# Patient Record
Sex: Female | Born: 1935
Health system: Southern US, Community
[De-identification: ages and names within clinical notes are randomized; demographics above are authoritative.]

## PROBLEM LIST (undated history)

## (undated) DIAGNOSIS — L819 Disorder of pigmentation, unspecified: Secondary | ICD-10-CM

## (undated) DIAGNOSIS — E079 Disorder of thyroid, unspecified: Secondary | ICD-10-CM

## (undated) DIAGNOSIS — E785 Hyperlipidemia, unspecified: Secondary | ICD-10-CM

## (undated) DIAGNOSIS — I10 Essential (primary) hypertension: Secondary | ICD-10-CM

## (undated) DIAGNOSIS — I739 Peripheral vascular disease, unspecified: Secondary | ICD-10-CM

## (undated) HISTORY — DX: Essential (primary) hypertension: I10

## (undated) HISTORY — DX: Disorder of pigmentation, unspecified: L81.9

## (undated) HISTORY — DX: Peripheral vascular disease, unspecified: I73.9

## (undated) HISTORY — DX: Hyperlipidemia, unspecified: E78.5

---

## 2003-10-07 ENCOUNTER — Encounter: Admission: RE | Admit: 2003-10-07 | Discharge: 2003-10-07 | Payer: Self-pay | Admitting: Neurosurgery

## 2004-04-21 ENCOUNTER — Ambulatory Visit (HOSPITAL_COMMUNITY): Admission: RE | Admit: 2004-04-21 | Discharge: 2004-04-21 | Payer: Self-pay | Admitting: Neurosurgery

## 2011-08-30 DIAGNOSIS — L819 Disorder of pigmentation, unspecified: Secondary | ICD-10-CM

## 2011-08-30 HISTORY — DX: Disorder of pigmentation, unspecified: L81.9

## 2011-12-13 DIAGNOSIS — M5430 Sciatica, unspecified side: Secondary | ICD-10-CM | POA: Diagnosis not present

## 2011-12-13 DIAGNOSIS — IMO0002 Reserved for concepts with insufficient information to code with codable children: Secondary | ICD-10-CM | POA: Diagnosis not present

## 2011-12-28 DIAGNOSIS — H353290 Exudative age-related macular degeneration, unspecified eye, stage unspecified: Secondary | ICD-10-CM | POA: Diagnosis not present

## 2011-12-29 DIAGNOSIS — S32009A Unspecified fracture of unspecified lumbar vertebra, initial encounter for closed fracture: Secondary | ICD-10-CM | POA: Diagnosis not present

## 2012-01-07 DIAGNOSIS — M5126 Other intervertebral disc displacement, lumbar region: Secondary | ICD-10-CM | POA: Diagnosis not present

## 2012-01-07 DIAGNOSIS — M48061 Spinal stenosis, lumbar region without neurogenic claudication: Secondary | ICD-10-CM | POA: Diagnosis not present

## 2012-01-07 DIAGNOSIS — S32009A Unspecified fracture of unspecified lumbar vertebra, initial encounter for closed fracture: Secondary | ICD-10-CM | POA: Diagnosis not present

## 2012-01-11 DIAGNOSIS — M48061 Spinal stenosis, lumbar region without neurogenic claudication: Secondary | ICD-10-CM | POA: Diagnosis not present

## 2012-01-17 DIAGNOSIS — M48061 Spinal stenosis, lumbar region without neurogenic claudication: Secondary | ICD-10-CM | POA: Diagnosis not present

## 2012-01-17 DIAGNOSIS — S32009A Unspecified fracture of unspecified lumbar vertebra, initial encounter for closed fracture: Secondary | ICD-10-CM | POA: Diagnosis not present

## 2012-01-25 DIAGNOSIS — H353290 Exudative age-related macular degeneration, unspecified eye, stage unspecified: Secondary | ICD-10-CM | POA: Diagnosis not present

## 2012-02-07 DIAGNOSIS — M48061 Spinal stenosis, lumbar region without neurogenic claudication: Secondary | ICD-10-CM | POA: Diagnosis not present

## 2012-02-07 DIAGNOSIS — S32009A Unspecified fracture of unspecified lumbar vertebra, initial encounter for closed fracture: Secondary | ICD-10-CM | POA: Diagnosis not present

## 2012-03-07 DIAGNOSIS — H353290 Exudative age-related macular degeneration, unspecified eye, stage unspecified: Secondary | ICD-10-CM | POA: Diagnosis not present

## 2012-03-13 DIAGNOSIS — S32009A Unspecified fracture of unspecified lumbar vertebra, initial encounter for closed fracture: Secondary | ICD-10-CM | POA: Diagnosis not present

## 2012-03-13 DIAGNOSIS — M48061 Spinal stenosis, lumbar region without neurogenic claudication: Secondary | ICD-10-CM | POA: Diagnosis not present

## 2012-04-25 DIAGNOSIS — H353290 Exudative age-related macular degeneration, unspecified eye, stage unspecified: Secondary | ICD-10-CM | POA: Diagnosis not present

## 2012-04-28 DIAGNOSIS — E860 Dehydration: Secondary | ICD-10-CM | POA: Diagnosis not present

## 2012-04-28 DIAGNOSIS — R634 Abnormal weight loss: Secondary | ICD-10-CM | POA: Diagnosis not present

## 2012-04-28 DIAGNOSIS — N3000 Acute cystitis without hematuria: Secondary | ICD-10-CM | POA: Diagnosis not present

## 2012-04-28 DIAGNOSIS — R1013 Epigastric pain: Secondary | ICD-10-CM | POA: Diagnosis not present

## 2012-05-01 DIAGNOSIS — R630 Anorexia: Secondary | ICD-10-CM | POA: Diagnosis not present

## 2012-05-01 DIAGNOSIS — E860 Dehydration: Secondary | ICD-10-CM | POA: Diagnosis not present

## 2012-05-05 DIAGNOSIS — R110 Nausea: Secondary | ICD-10-CM | POA: Diagnosis not present

## 2012-05-05 DIAGNOSIS — K7689 Other specified diseases of liver: Secondary | ICD-10-CM | POA: Diagnosis not present

## 2012-05-05 DIAGNOSIS — R1013 Epigastric pain: Secondary | ICD-10-CM | POA: Diagnosis not present

## 2012-05-05 DIAGNOSIS — R634 Abnormal weight loss: Secondary | ICD-10-CM | POA: Diagnosis not present

## 2012-05-10 DIAGNOSIS — IMO0002 Reserved for concepts with insufficient information to code with codable children: Secondary | ICD-10-CM | POA: Diagnosis not present

## 2012-05-23 DIAGNOSIS — H353290 Exudative age-related macular degeneration, unspecified eye, stage unspecified: Secondary | ICD-10-CM | POA: Diagnosis not present

## 2012-05-25 DIAGNOSIS — H40059 Ocular hypertension, unspecified eye: Secondary | ICD-10-CM | POA: Diagnosis not present

## 2012-06-02 DIAGNOSIS — R935 Abnormal findings on diagnostic imaging of other abdominal regions, including retroperitoneum: Secondary | ICD-10-CM | POA: Diagnosis not present

## 2012-06-15 DIAGNOSIS — H40059 Ocular hypertension, unspecified eye: Secondary | ICD-10-CM | POA: Diagnosis not present

## 2012-07-03 DIAGNOSIS — L0100 Impetigo, unspecified: Secondary | ICD-10-CM | POA: Diagnosis not present

## 2012-07-09 DIAGNOSIS — L02439 Carbuncle of limb, unspecified: Secondary | ICD-10-CM | POA: Diagnosis not present

## 2012-07-09 DIAGNOSIS — L02429 Furuncle of limb, unspecified: Secondary | ICD-10-CM | POA: Diagnosis not present

## 2012-07-11 DIAGNOSIS — H353290 Exudative age-related macular degeneration, unspecified eye, stage unspecified: Secondary | ICD-10-CM | POA: Diagnosis not present

## 2012-07-11 DIAGNOSIS — L02818 Cutaneous abscess of other sites: Secondary | ICD-10-CM | POA: Diagnosis not present

## 2012-07-11 DIAGNOSIS — L03818 Cellulitis of other sites: Secondary | ICD-10-CM | POA: Diagnosis not present

## 2012-07-11 DIAGNOSIS — IMO0002 Reserved for concepts with insufficient information to code with codable children: Secondary | ICD-10-CM | POA: Diagnosis not present

## 2012-07-24 DIAGNOSIS — L0390 Cellulitis, unspecified: Secondary | ICD-10-CM | POA: Diagnosis not present

## 2012-07-24 DIAGNOSIS — L0291 Cutaneous abscess, unspecified: Secondary | ICD-10-CM | POA: Diagnosis not present

## 2012-08-08 DIAGNOSIS — N3000 Acute cystitis without hematuria: Secondary | ICD-10-CM | POA: Diagnosis not present

## 2012-08-08 DIAGNOSIS — Z23 Encounter for immunization: Secondary | ICD-10-CM | POA: Diagnosis not present

## 2012-08-08 DIAGNOSIS — Z79899 Other long term (current) drug therapy: Secondary | ICD-10-CM | POA: Diagnosis not present

## 2012-08-08 DIAGNOSIS — E559 Vitamin D deficiency, unspecified: Secondary | ICD-10-CM | POA: Diagnosis not present

## 2012-08-08 DIAGNOSIS — M810 Age-related osteoporosis without current pathological fracture: Secondary | ICD-10-CM | POA: Diagnosis not present

## 2012-08-08 DIAGNOSIS — R413 Other amnesia: Secondary | ICD-10-CM | POA: Diagnosis not present

## 2012-08-15 DIAGNOSIS — S32009A Unspecified fracture of unspecified lumbar vertebra, initial encounter for closed fracture: Secondary | ICD-10-CM | POA: Diagnosis not present

## 2012-08-15 DIAGNOSIS — M48061 Spinal stenosis, lumbar region without neurogenic claudication: Secondary | ICD-10-CM | POA: Diagnosis not present

## 2012-08-16 DIAGNOSIS — Z1231 Encounter for screening mammogram for malignant neoplasm of breast: Secondary | ICD-10-CM | POA: Diagnosis not present

## 2012-08-23 DIAGNOSIS — R229 Localized swelling, mass and lump, unspecified: Secondary | ICD-10-CM | POA: Diagnosis not present

## 2012-09-05 DIAGNOSIS — H353290 Exudative age-related macular degeneration, unspecified eye, stage unspecified: Secondary | ICD-10-CM | POA: Diagnosis not present

## 2012-10-06 DIAGNOSIS — IMO0002 Reserved for concepts with insufficient information to code with codable children: Secondary | ICD-10-CM | POA: Diagnosis not present

## 2012-10-06 DIAGNOSIS — S32009A Unspecified fracture of unspecified lumbar vertebra, initial encounter for closed fracture: Secondary | ICD-10-CM | POA: Diagnosis not present

## 2012-10-06 DIAGNOSIS — M48061 Spinal stenosis, lumbar region without neurogenic claudication: Secondary | ICD-10-CM | POA: Diagnosis not present

## 2012-10-17 DIAGNOSIS — H40059 Ocular hypertension, unspecified eye: Secondary | ICD-10-CM | POA: Diagnosis not present

## 2012-10-30 DIAGNOSIS — IMO0002 Reserved for concepts with insufficient information to code with codable children: Secondary | ICD-10-CM | POA: Diagnosis not present

## 2012-10-30 DIAGNOSIS — M48061 Spinal stenosis, lumbar region without neurogenic claudication: Secondary | ICD-10-CM | POA: Diagnosis not present

## 2012-10-30 DIAGNOSIS — S32009A Unspecified fracture of unspecified lumbar vertebra, initial encounter for closed fracture: Secondary | ICD-10-CM | POA: Diagnosis not present

## 2012-11-20 DIAGNOSIS — IMO0002 Reserved for concepts with insufficient information to code with codable children: Secondary | ICD-10-CM | POA: Diagnosis not present

## 2012-11-20 DIAGNOSIS — M48061 Spinal stenosis, lumbar region without neurogenic claudication: Secondary | ICD-10-CM | POA: Diagnosis not present

## 2012-11-20 DIAGNOSIS — S32009A Unspecified fracture of unspecified lumbar vertebra, initial encounter for closed fracture: Secondary | ICD-10-CM | POA: Diagnosis not present

## 2012-12-12 DIAGNOSIS — H353290 Exudative age-related macular degeneration, unspecified eye, stage unspecified: Secondary | ICD-10-CM | POA: Diagnosis not present

## 2013-03-13 DIAGNOSIS — H353290 Exudative age-related macular degeneration, unspecified eye, stage unspecified: Secondary | ICD-10-CM | POA: Diagnosis not present

## 2013-03-30 DIAGNOSIS — R0609 Other forms of dyspnea: Secondary | ICD-10-CM | POA: Diagnosis not present

## 2013-03-30 DIAGNOSIS — Z0000 Encounter for general adult medical examination without abnormal findings: Secondary | ICD-10-CM | POA: Diagnosis not present

## 2013-03-30 DIAGNOSIS — R0989 Other specified symptoms and signs involving the circulatory and respiratory systems: Secondary | ICD-10-CM | POA: Diagnosis not present

## 2013-03-30 DIAGNOSIS — I8310 Varicose veins of unspecified lower extremity with inflammation: Secondary | ICD-10-CM | POA: Diagnosis not present

## 2013-04-09 DIAGNOSIS — R0609 Other forms of dyspnea: Secondary | ICD-10-CM | POA: Diagnosis not present

## 2013-04-09 DIAGNOSIS — I059 Rheumatic mitral valve disease, unspecified: Secondary | ICD-10-CM | POA: Diagnosis not present

## 2013-04-16 DIAGNOSIS — H353190 Nonexudative age-related macular degeneration, unspecified eye, stage unspecified: Secondary | ICD-10-CM | POA: Diagnosis not present

## 2013-05-04 DIAGNOSIS — H524 Presbyopia: Secondary | ICD-10-CM | POA: Diagnosis not present

## 2013-05-16 DIAGNOSIS — M5137 Other intervertebral disc degeneration, lumbosacral region: Secondary | ICD-10-CM | POA: Diagnosis not present

## 2013-05-22 DIAGNOSIS — H353290 Exudative age-related macular degeneration, unspecified eye, stage unspecified: Secondary | ICD-10-CM | POA: Diagnosis not present

## 2013-05-28 DIAGNOSIS — L2089 Other atopic dermatitis: Secondary | ICD-10-CM | POA: Diagnosis not present

## 2013-05-29 ENCOUNTER — Encounter: Payer: Self-pay | Admitting: Pharmacist Clinician (PhC)/ Clinical Pharmacy Specialist

## 2013-06-06 ENCOUNTER — Encounter: Payer: Self-pay | Admitting: Cardiovascular Disease

## 2013-06-13 ENCOUNTER — Ambulatory Visit (INDEPENDENT_AMBULATORY_CARE_PROVIDER_SITE_OTHER): Payer: Medicare Other | Admitting: Cardiovascular Disease

## 2013-06-13 ENCOUNTER — Ambulatory Visit
Admission: RE | Admit: 2013-06-13 | Discharge: 2013-06-13 | Disposition: A | Discharge status: Home / Self Care | Payer: Medicare Other | Source: Ambulatory Visit | Attending: Cardiovascular Disease | Admitting: Cardiovascular Disease

## 2013-06-13 ENCOUNTER — Encounter: Payer: Self-pay | Admitting: Cardiovascular Disease

## 2013-06-13 VITALS — BP 144/74 | HR 59 | Ht 62.0 in | Wt 142.4 lb

## 2013-06-13 DIAGNOSIS — R0600 Dyspnea, unspecified: Secondary | ICD-10-CM

## 2013-06-13 DIAGNOSIS — R0609 Other forms of dyspnea: Secondary | ICD-10-CM | POA: Diagnosis not present

## 2013-06-13 DIAGNOSIS — R0989 Other specified symptoms and signs involving the circulatory and respiratory systems: Secondary | ICD-10-CM

## 2013-06-13 DIAGNOSIS — J984 Other disorders of lung: Secondary | ICD-10-CM | POA: Diagnosis not present

## 2013-06-13 NOTE — Patient Instructions (Addendum)
PA and Lateral CXR to be performed at [address] [facility].  BNP - lab test- have drawn at [facility] - next door to [facility].  Your physician has recommended that you have a pulmonary function test. Pulmonary Function Tests are a group of tests that measure how well air moves in and out of your lungs.  Your physician recommends that you schedule a follow-up appointment in: 2-3 weeks following all tests.

## 2013-06-14 LAB — BRAIN NATRIURETIC PEPTIDE: Brain Natriuretic Peptide: 68.9 pg/mL (ref 0.0–100.0)

## 2013-06-15 ENCOUNTER — Telehealth: Payer: Self-pay | Admitting: *Deleted

## 2013-06-15 NOTE — Telephone Encounter (Signed)
Message copied by Vita Barley on Fri Jun 15, 2013  9:13 PM ------      Message from: Thurmon Fair      Created: Wed Jun 13, 2013  4:40 PM       CXR shows some slight scarring in the lungs -  Will discuss further at the follow up visit once we have PFTs. ------

## 2013-06-15 NOTE — Telephone Encounter (Signed)
Results of CXR given to pt and will review further after PFT's and next follow-up visit.

## 2013-06-17 DIAGNOSIS — J479 Bronchiectasis, uncomplicated: Secondary | ICD-10-CM | POA: Insufficient documentation

## 2013-06-17 DIAGNOSIS — R0600 Dyspnea, unspecified: Secondary | ICD-10-CM | POA: Insufficient documentation

## 2013-06-17 NOTE — Progress Notes (Signed)
Patient ID: Deanna Meyers, female   DOB: [birth year], 79 y.o.   MRN: [id number]     Reason for office visit Dyspnea  Deanna Meyers is referred in consultation by Dr. Sherral Hammers due to increasing dyspnea over the last 2 months. Referring diagnosis suggest this on exertion but discussion with the patient and her daughter suggests more problems with intermittent dyspnea at rest. Sometimes her daughter has noticed that her mother is short of breath while speaking on the phone. The patient appears to be less aware of this and the rest of the family. Occasionally she has wheezing.  Deanna Meyers recently underwent an echocardiogram in May of this year which showed normal left ventricular size and systolic function. There were no significant valvular abnormalities. The diastolic filling pattern was described as showing impaired relaxation, but one looks at the actual measurements today are appropriate for age. E/e' is not elevated suggesting normal filling pressures. The only finding raises concern for cardiac source of dyspnea is the markedly dilated left atrium at 52 mm.  Other than her age Deanna Meyers has little in the way of risk factors for coronary disease or other structural heart problems. She has taken statins for dyspnea and has intermittently required treatment for it appears to be very mild hypertension.  By duplex ultrasonography she has been demonstrated to have "small vessel disease" in her feet, but has no evidence whatsoever of large arterial obstruction.    No Known Allergies  Current Outpatient Prescriptions  Medication Sig Dispense Refill  . clopidogrel (PLAVIX) 75 MG tablet Take 75 mg by mouth daily.      Marland Kitchen donepezil (ARICEPT) 23 MG TABS tablet Take 23 mg by mouth at bedtime.      . hydrochlorothiazide (HYDRODIURIL) 25 MG tablet Take 25 mg by mouth daily.      . simvastatin (ZOCOR) 20 MG tablet Take 20 mg by mouth every evening.       No current facility-administered  medications for this visit.    Past Medical History  Diagnosis Date  . Discoloration of skin of toe 08/30/2011    LA doppler - bilateral ABI's - no evidence of arterial insufficiency; bilateral PVRs - normal waveforms; bilateral toe pressures - mod/severe perfusion for healing  . Hypertension   . PVD (peripheral vascular disease)     decreased blood flow to toes  . Hyperlipidemia     No past surgical history on file.  No family history on file.  History   Social History  . Marital Status: Widowed    Spouse Name: N/A    Number of Children: N/A  . Years of Education: N/A   Occupational History  . Not on file.   Social History Main Topics  . Smoking status: Never Smoker   . Smokeless tobacco: Not on file  . Alcohol Use: No  . Drug Use: No  . Sexually Active: Not on file   Other Topics Concern  . Not on file   Social History Narrative  . No narrative on file    Review of systems: The patient specifically denies any chest pain at rest or with exertion,  orthopnea, paroxysmal nocturnal dyspnea, syncope, palpitations, focal neurological deficits, intermittent claudication, lower extremity edema, unexplained weight gain, cough, hemoptysis.  The patient also denies abdominal pain, nausea, vomiting, dysphagia, diarrhea, constipation, polyuria, polydipsia, dysuria, hematuria, frequency, urgency, abnormal bleeding or bruising, fever, chills, unexpected weight changes, mood swings, change in skin or hair texture, change in voice quality, auditory or visual  problems, allergic reactions or rashes, new musculoskeletal complaints other than usual "aches and pains".   PHYSICAL EXAM BP 144/74  Pulse 59  Ht 5\' 2"  (1.575 m)  Wt 142 lb 6.4 oz (64.592 kg)  BMI 26.04 kg/m2  General: Alert, oriented x3, no distress Head: no evidence of trauma, PERRL, EOMI, no exophtalmos or lid lag, no myxedema, no xanthelasma; normal ears, nose and oropharynx Neck: normal jugular venous pulsations and  no hepatojugular reflux; brisk carotid pulses without delay and no carotid bruits Chest: clear to auscultation, no signs of consolidation by percussion or palpation, normal fremitus, symmetrical and full respiratory excursions; faint wheezing is heard only on forced expiration Cardiovascular: normal position and quality of the apical impulse, regular rhythm, normal first and second heart sounds, no murmurs, rubs or gallops Abdomen: no tenderness or distention, no masses by palpation, no abnormal pulsatility or arterial bruits, normal bowel sounds, no hepatosplenomegaly Extremities: no clubbing, cyanosis or edema; 2+ radial, ulnar and brachial pulses bilaterally; 2+ right femoral, posterior tibial and dorsalis pedis pulses; 2+ left femoral, posterior tibial and dorsalis pedis pulses; no subclavian or femoral bruits Neurological: grossly nonfocal   EKG: Sinus rhythm, left axis deviation, otherwise normal  Lipid Panel  No results found for this basename: chol, trig, hdl, cholhdl, vldl, ldlcalc    BMET No results found for this basename: na, k, cl, co2, glucose, bun, creatinine, calcium, gfrnonaa, gfraa     ASSESSMENT AND PLAN  [name] is shortness of breath does not appear to be particularly severe it is also not clearly associated with physical activity. The echocardiogram shows a severely data the left atrium but otherwise no signs of a cardiac cause of dyspnea. Indeed the filling pressures by Doppler values were normal.  I suggest we brought in our range of differential diagnosis. I recommend that she have a PA and lateral chest x-ray, simple spirometry pulmonary function tests and check a BNP. I suspicion is that her dyspnea is not cardiac in etiology.  She'll followup after all the studies are performed.  Orders Placed This Encounter  Procedures  . DG Chest 2 View  . B Nat Peptide  . PFT ONLY (MET-TEST)  . EKG 12-Lead   Meds ordered this encounter  Medications  . donepezil  (ARICEPT) 23 MG TABS tablet    Sig: Take 23 mg by mouth at bedtime.    Junious Silk, MD, [facility] [phone] office [phone] pager

## 2013-06-18 ENCOUNTER — Telehealth: Payer: Self-pay | Admitting: *Deleted

## 2013-06-18 DIAGNOSIS — L259 Unspecified contact dermatitis, unspecified cause: Secondary | ICD-10-CM | POA: Diagnosis not present

## 2013-06-18 NOTE — Telephone Encounter (Signed)
Lab results given to patient.

## 2013-06-18 NOTE — Telephone Encounter (Signed)
Message copied by Vita Barley on Mon Jun 18, 2013 11:00 AM ------      Message from: Thurmon Fair      Created: Thu Jun 14, 2013  4:03 PM       Very normal result - confirms suspicion that her shortness of breath is NOT heart related ------

## 2013-07-02 ENCOUNTER — Encounter (INDEPENDENT_AMBULATORY_CARE_PROVIDER_SITE_OTHER): Payer: Medicare Other

## 2013-07-02 DIAGNOSIS — R0989 Other specified symptoms and signs involving the circulatory and respiratory systems: Secondary | ICD-10-CM

## 2013-07-04 ENCOUNTER — Other Ambulatory Visit: Payer: Self-pay

## 2013-07-08 ENCOUNTER — Encounter: Payer: Self-pay | Admitting: *Deleted

## 2013-07-12 ENCOUNTER — Encounter: Payer: Self-pay | Admitting: Cardiovascular Disease

## 2013-07-12 ENCOUNTER — Ambulatory Visit (INDEPENDENT_AMBULATORY_CARE_PROVIDER_SITE_OTHER): Payer: Medicare Other | Admitting: Cardiovascular Disease

## 2013-07-12 VITALS — BP 140/84 | HR 68 | Resp 18 | Ht 62.0 in | Wt 141.1 lb

## 2013-07-12 DIAGNOSIS — R0989 Other specified symptoms and signs involving the circulatory and respiratory systems: Secondary | ICD-10-CM | POA: Diagnosis not present

## 2013-07-12 DIAGNOSIS — R0600 Dyspnea, unspecified: Secondary | ICD-10-CM

## 2013-07-12 DIAGNOSIS — R0609 Other forms of dyspnea: Secondary | ICD-10-CM | POA: Diagnosis not present

## 2013-07-12 NOTE — Patient Instructions (Addendum)
Your physician has requested that you have a lexiscan myoview. For further information please visit [email or web address]. Please follow instruction sheet, as given.   Your physician recommends that you schedule a follow-up appointment in: AS NEEDED 

## 2013-07-17 ENCOUNTER — Telehealth (HOSPITAL_COMMUNITY): Payer: Self-pay | Admitting: Cardiovascular Disease

## 2013-07-17 NOTE — Progress Notes (Signed)
Patient ID: Deanna Meyers, female   DOB: 24-Oct-1921, 79 y.o.   MRN: [id number]     Reason for office visit Followup echocardiogram and pulmonary function tests and chest x-ray  Deanna Meyers returns today again accompanied by her daughter. They both believe that her shortness of breath is worsening. The workup so far has not identified an etiology. Her pulmonary function tests were essentially normal. Her chest x-ray shows some mild upper lobe scarring but no serious infiltrates. Her BNP level was normal. Her echo did not show evidence of either systolic or diastolic dysfunction, nor did he show any serious valvular problems. The left atrium surprisingly was markedly dilated.    No Known Allergies  Current Outpatient Prescriptions  Medication Sig Dispense Refill  . clopidogrel (PLAVIX) 75 MG tablet Take 75 mg by mouth daily.      Marland Kitchen donepezil (ARICEPT) 23 MG TABS tablet Take 23 mg by mouth at bedtime.      . hydrochlorothiazide (HYDRODIURIL) 25 MG tablet Take 25 mg by mouth daily.      . simvastatin (ZOCOR) 20 MG tablet Take 20 mg by mouth every evening.       No current facility-administered medications for this visit.    Past Medical History  Diagnosis Date  . Discoloration of skin of toe 08/30/2011    LA doppler - bilateral ABI's - no evidence of arterial insufficiency; bilateral PVRs - normal waveforms; bilateral toe pressures - mod/severe perfusion for healing  . Hypertension   . PVD (peripheral vascular disease)     decreased blood flow to toes  . Hyperlipidemia     No past major surgeries.  No family history of premature CAD or other vascular disease. Her mother died age 50 with an "enlarged heart"  History   Social History  . Marital Status: Widowed    Spouse Name: N/A    Number of Children: N/A  . Years of Education: N/A   Occupational History  . Not on file.   Social History Main Topics  . Smoking status: Never Smoker   . Smokeless tobacco: Not on file  .  Alcohol Use: No  . Drug Use: No  . Sexual Activity: Not on file   Other Topics Concern  . Not on file   Social History Narrative  . No narrative on file    Review of systems: The patient specifically denies any chest pain at rest or with exertion, orthopnea, paroxysmal nocturnal dyspnea, syncope, palpitations, focal neurological deficits, intermittent claudication, lower extremity edema, unexplained weight gain, cough, hemoptysis.  The patient also denies abdominal pain, nausea, vomiting, dysphagia, diarrhea, constipation, polyuria, polydipsia, dysuria, hematuria, frequency, urgency, abnormal bleeding or bruising, fever, chills, unexpected weight changes, mood swings, change in skin or hair texture, change in voice quality, auditory or visual problems, allergic reactions or rashes, new musculoskeletal complaints other than usual "aches and pains".   PHYSICAL EXAM BP 140/84  Pulse 68  Resp 18  Ht 5\' 2"  (1.575 m)  Wt 141 lb 1.6 oz (64.003 kg)  BMI 25.8 kg/m2 General: Alert, oriented x3, no distress  Head: no evidence of trauma, PERRL, EOMI, no exophtalmos or lid lag, no myxedema, no xanthelasma; normal ears, nose and oropharynx  Neck: normal jugular venous pulsations and no hepatojugular reflux; brisk carotid pulses without delay and no carotid bruits  Chest: clear to auscultation, no signs of consolidation by percussion or palpation, normal fremitus, symmetrical and full respiratory excursions; faint wheezing is heard only on forced expiration  Cardiovascular:  normal position and quality of the apical impulse, regular rhythm, normal first and second heart sounds, no murmurs, rubs or gallops  Abdomen: no tenderness or distention, no masses by palpation, no abnormal pulsatility or arterial bruits, normal bowel sounds, no hepatosplenomegaly  Extremities: no clubbing, cyanosis or edema; 2+ radial, ulnar and brachial pulses bilaterally; 2+ right femoral, posterior tibial and dorsalis pedis  pulses; 2+ left femoral, posterior tibial and dorsalis pedis pulses; no subclavian or femoral bruits  Neurological: grossly nonfocal   EKG: Sinus rhythm, left axis deviation, otherwise normal   ASSESSMENT AND PLAN Dyspnea This continues to bother Deanna Meyers a lot. Her pulmonary function tests and chest x-ray did not really identify a clear etiology for it. She has normal left ventricular systolic function and by echo criteria and BNP levels there does not appear to be anything to support myocardial dysfunction as a cause of her dyspnea. The only cardiac avenue we have not investigated would be that of coronary disease, with dyspnea presenting as an angina equivalent. I have therefore recommended that she have a lexiscan myoview. She is not able to perform treadmill exercise.  Orders Placed This Encounter  Procedures  . Myocardial Perfusion Imaging   Egan Sahlin  Thurmon Fair, MD, [facility] [phone] office [phone] pager

## 2013-07-17 NOTE — Assessment & Plan Note (Signed)
This continues to bother Deanna Meyers a lot. Her pulmonary function tests and chest x-ray did not really identify a clear etiology for it. She has normal left ventricular systolic function and by echo criteria and BNP levels there does not appear to be anything to support myocardial dysfunction as a cause of her dyspnea. The only cardiac avenue we have not investigated would be that of coronary disease, with dyspnea presenting as an angina equivalent. I have therefore recommended that she have a lexiscan myoview. She is not able to perform treadmill exercise.

## 2013-07-19 ENCOUNTER — Encounter (HOSPITAL_COMMUNITY): Payer: Medicare Other

## 2013-08-07 DIAGNOSIS — H353290 Exudative age-related macular degeneration, unspecified eye, stage unspecified: Secondary | ICD-10-CM | POA: Diagnosis not present

## 2013-08-10 DIAGNOSIS — Z23 Encounter for immunization: Secondary | ICD-10-CM | POA: Diagnosis not present

## 2013-08-10 DIAGNOSIS — A46 Erysipelas: Secondary | ICD-10-CM | POA: Diagnosis not present

## 2013-08-10 DIAGNOSIS — E559 Vitamin D deficiency, unspecified: Secondary | ICD-10-CM | POA: Diagnosis not present

## 2013-08-10 DIAGNOSIS — M899 Disorder of bone, unspecified: Secondary | ICD-10-CM | POA: Diagnosis not present

## 2013-08-10 DIAGNOSIS — I10 Essential (primary) hypertension: Secondary | ICD-10-CM | POA: Diagnosis not present

## 2013-08-10 DIAGNOSIS — E782 Mixed hyperlipidemia: Secondary | ICD-10-CM | POA: Diagnosis not present

## 2013-08-10 DIAGNOSIS — R413 Other amnesia: Secondary | ICD-10-CM | POA: Diagnosis not present

## 2013-08-17 DIAGNOSIS — Z1231 Encounter for screening mammogram for malignant neoplasm of breast: Secondary | ICD-10-CM | POA: Diagnosis not present

## 2013-09-12 DIAGNOSIS — I8310 Varicose veins of unspecified lower extremity with inflammation: Secondary | ICD-10-CM | POA: Diagnosis not present

## 2013-09-30 DIAGNOSIS — Z79899 Other long term (current) drug therapy: Secondary | ICD-10-CM | POA: Diagnosis not present

## 2013-09-30 DIAGNOSIS — X503XXA Overexertion from repetitive movements, initial encounter: Secondary | ICD-10-CM | POA: Diagnosis not present

## 2013-09-30 DIAGNOSIS — IMO0002 Reserved for concepts with insufficient information to code with codable children: Secondary | ICD-10-CM | POA: Diagnosis not present

## 2013-09-30 DIAGNOSIS — R1013 Epigastric pain: Secondary | ICD-10-CM | POA: Diagnosis not present

## 2013-09-30 DIAGNOSIS — R079 Chest pain, unspecified: Secondary | ICD-10-CM | POA: Diagnosis not present

## 2013-09-30 DIAGNOSIS — E7800 Pure hypercholesterolemia, unspecified: Secondary | ICD-10-CM | POA: Diagnosis not present

## 2013-09-30 DIAGNOSIS — F0390 Unspecified dementia without behavioral disturbance: Secondary | ICD-10-CM | POA: Diagnosis not present

## 2013-10-04 ENCOUNTER — Other Ambulatory Visit: Payer: Self-pay

## 2013-10-23 DIAGNOSIS — K8020 Calculus of gallbladder without cholecystitis without obstruction: Secondary | ICD-10-CM | POA: Diagnosis not present

## 2013-10-23 DIAGNOSIS — K7689 Other specified diseases of liver: Secondary | ICD-10-CM | POA: Diagnosis not present

## 2013-10-23 DIAGNOSIS — R1011 Right upper quadrant pain: Secondary | ICD-10-CM | POA: Diagnosis not present

## 2013-10-23 DIAGNOSIS — R109 Unspecified abdominal pain: Secondary | ICD-10-CM | POA: Diagnosis not present

## 2013-10-31 DIAGNOSIS — K8010 Calculus of gallbladder with chronic cholecystitis without obstruction: Secondary | ICD-10-CM | POA: Diagnosis not present

## 2013-11-02 DIAGNOSIS — Z01812 Encounter for preprocedural laboratory examination: Secondary | ICD-10-CM | POA: Diagnosis not present

## 2013-11-02 DIAGNOSIS — Z01810 Encounter for preprocedural cardiovascular examination: Secondary | ICD-10-CM | POA: Diagnosis not present

## 2013-11-02 DIAGNOSIS — K8020 Calculus of gallbladder without cholecystitis without obstruction: Secondary | ICD-10-CM | POA: Diagnosis not present

## 2013-11-02 DIAGNOSIS — R9431 Abnormal electrocardiogram [ECG] [EKG]: Secondary | ICD-10-CM | POA: Diagnosis not present

## 2013-11-02 DIAGNOSIS — I10 Essential (primary) hypertension: Secondary | ICD-10-CM | POA: Diagnosis not present

## 2013-11-05 DIAGNOSIS — H353290 Exudative age-related macular degeneration, unspecified eye, stage unspecified: Secondary | ICD-10-CM | POA: Diagnosis not present

## 2013-11-12 DIAGNOSIS — Z7902 Long term (current) use of antithrombotics/antiplatelets: Secondary | ICD-10-CM | POA: Diagnosis not present

## 2013-11-12 DIAGNOSIS — E7800 Pure hypercholesterolemia, unspecified: Secondary | ICD-10-CM | POA: Diagnosis not present

## 2013-11-12 DIAGNOSIS — M818 Other osteoporosis without current pathological fracture: Secondary | ICD-10-CM | POA: Diagnosis not present

## 2013-11-12 DIAGNOSIS — Z79899 Other long term (current) drug therapy: Secondary | ICD-10-CM | POA: Diagnosis not present

## 2013-11-12 DIAGNOSIS — K8010 Calculus of gallbladder with chronic cholecystitis without obstruction: Secondary | ICD-10-CM | POA: Diagnosis not present

## 2013-11-12 DIAGNOSIS — I739 Peripheral vascular disease, unspecified: Secondary | ICD-10-CM | POA: Diagnosis not present

## 2013-11-12 DIAGNOSIS — H3530 Unspecified macular degeneration: Secondary | ICD-10-CM | POA: Diagnosis not present

## 2013-11-12 DIAGNOSIS — K811 Chronic cholecystitis: Secondary | ICD-10-CM | POA: Diagnosis not present

## 2013-11-12 HISTORY — PX: CHOLECYSTECTOMY: SHX55

## 2013-12-04 DIAGNOSIS — J189 Pneumonia, unspecified organism: Secondary | ICD-10-CM | POA: Diagnosis not present

## 2013-12-06 DIAGNOSIS — Z23 Encounter for immunization: Secondary | ICD-10-CM | POA: Diagnosis not present

## 2013-12-06 DIAGNOSIS — J189 Pneumonia, unspecified organism: Secondary | ICD-10-CM | POA: Diagnosis not present

## 2013-12-06 DIAGNOSIS — R1013 Epigastric pain: Secondary | ICD-10-CM | POA: Diagnosis not present

## 2013-12-11 DIAGNOSIS — R5381 Other malaise: Secondary | ICD-10-CM | POA: Diagnosis not present

## 2013-12-11 DIAGNOSIS — R918 Other nonspecific abnormal finding of lung field: Secondary | ICD-10-CM | POA: Diagnosis not present

## 2013-12-11 DIAGNOSIS — R9431 Abnormal electrocardiogram [ECG] [EKG]: Secondary | ICD-10-CM | POA: Diagnosis not present

## 2013-12-11 DIAGNOSIS — Z7902 Long term (current) use of antithrombotics/antiplatelets: Secondary | ICD-10-CM | POA: Diagnosis not present

## 2013-12-11 DIAGNOSIS — J40 Bronchitis, not specified as acute or chronic: Secondary | ICD-10-CM | POA: Diagnosis not present

## 2013-12-11 DIAGNOSIS — I771 Stricture of artery: Secondary | ICD-10-CM | POA: Diagnosis not present

## 2013-12-11 DIAGNOSIS — J189 Pneumonia, unspecified organism: Secondary | ICD-10-CM | POA: Diagnosis not present

## 2013-12-11 DIAGNOSIS — I4460 Unspecified fascicular block: Secondary | ICD-10-CM | POA: Diagnosis not present

## 2013-12-11 DIAGNOSIS — R091 Pleurisy: Secondary | ICD-10-CM | POA: Diagnosis not present

## 2013-12-11 DIAGNOSIS — M4120 Other idiopathic scoliosis, site unspecified: Secondary | ICD-10-CM | POA: Diagnosis not present

## 2013-12-11 DIAGNOSIS — R5383 Other fatigue: Secondary | ICD-10-CM | POA: Diagnosis not present

## 2013-12-11 DIAGNOSIS — M899 Disorder of bone, unspecified: Secondary | ICD-10-CM | POA: Diagnosis not present

## 2013-12-12 DIAGNOSIS — J189 Pneumonia, unspecified organism: Secondary | ICD-10-CM | POA: Diagnosis not present

## 2013-12-19 DIAGNOSIS — R634 Abnormal weight loss: Secondary | ICD-10-CM | POA: Diagnosis not present

## 2013-12-19 DIAGNOSIS — R1012 Left upper quadrant pain: Secondary | ICD-10-CM | POA: Diagnosis not present

## 2014-01-01 DIAGNOSIS — R1012 Left upper quadrant pain: Secondary | ICD-10-CM | POA: Diagnosis not present

## 2014-01-01 DIAGNOSIS — K209 Esophagitis, unspecified without bleeding: Secondary | ICD-10-CM | POA: Diagnosis not present

## 2014-01-01 DIAGNOSIS — R634 Abnormal weight loss: Secondary | ICD-10-CM | POA: Diagnosis not present

## 2014-01-01 DIAGNOSIS — K319 Disease of stomach and duodenum, unspecified: Secondary | ICD-10-CM | POA: Diagnosis not present

## 2014-01-28 DIAGNOSIS — H40059 Ocular hypertension, unspecified eye: Secondary | ICD-10-CM | POA: Diagnosis not present

## 2014-01-28 DIAGNOSIS — H353290 Exudative age-related macular degeneration, unspecified eye, stage unspecified: Secondary | ICD-10-CM | POA: Diagnosis not present

## 2014-02-04 DIAGNOSIS — R634 Abnormal weight loss: Secondary | ICD-10-CM | POA: Diagnosis not present

## 2014-02-04 DIAGNOSIS — R1012 Left upper quadrant pain: Secondary | ICD-10-CM | POA: Diagnosis not present

## 2014-02-19 DIAGNOSIS — H353290 Exudative age-related macular degeneration, unspecified eye, stage unspecified: Secondary | ICD-10-CM | POA: Diagnosis not present

## 2014-03-26 DIAGNOSIS — H353290 Exudative age-related macular degeneration, unspecified eye, stage unspecified: Secondary | ICD-10-CM | POA: Diagnosis not present

## 2014-06-25 DIAGNOSIS — H353290 Exudative age-related macular degeneration, unspecified eye, stage unspecified: Secondary | ICD-10-CM | POA: Diagnosis not present

## 2014-08-12 DIAGNOSIS — Z1329 Encounter for screening for other suspected endocrine disorder: Secondary | ICD-10-CM | POA: Diagnosis not present

## 2014-08-12 DIAGNOSIS — N390 Urinary tract infection, site not specified: Secondary | ICD-10-CM | POA: Diagnosis not present

## 2014-08-12 DIAGNOSIS — F0390 Unspecified dementia without behavioral disturbance: Secondary | ICD-10-CM | POA: Diagnosis not present

## 2014-08-12 DIAGNOSIS — Z23 Encounter for immunization: Secondary | ICD-10-CM | POA: Diagnosis not present

## 2014-08-12 DIAGNOSIS — Z130 Encounter for screening for diseases of the blood and blood-forming organs and certain disorders involving the immune mechanism: Secondary | ICD-10-CM | POA: Diagnosis not present

## 2014-08-12 DIAGNOSIS — E782 Mixed hyperlipidemia: Secondary | ICD-10-CM | POA: Diagnosis not present

## 2014-08-12 DIAGNOSIS — I10 Essential (primary) hypertension: Secondary | ICD-10-CM | POA: Diagnosis not present

## 2014-08-12 DIAGNOSIS — H9190 Unspecified hearing loss, unspecified ear: Secondary | ICD-10-CM | POA: Diagnosis not present

## 2014-09-24 DIAGNOSIS — H3532 Exudative age-related macular degeneration: Secondary | ICD-10-CM | POA: Diagnosis not present

## 2014-11-18 DIAGNOSIS — Z1231 Encounter for screening mammogram for malignant neoplasm of breast: Secondary | ICD-10-CM | POA: Diagnosis not present

## 2014-12-05 DIAGNOSIS — I8310 Varicose veins of unspecified lower extremity with inflammation: Secondary | ICD-10-CM | POA: Diagnosis not present

## 2014-12-05 DIAGNOSIS — L300 Nummular dermatitis: Secondary | ICD-10-CM | POA: Diagnosis not present

## 2014-12-17 ENCOUNTER — Encounter: Payer: Self-pay | Admitting: Physician Assistant

## 2014-12-17 ENCOUNTER — Ambulatory Visit
Admission: RE | Admit: 2014-12-17 | Discharge: 2014-12-17 | Disposition: A | Discharge status: Home / Self Care | Payer: Medicare Other | Source: Ambulatory Visit | Attending: Physician Assistant | Admitting: Physician Assistant

## 2014-12-17 ENCOUNTER — Ambulatory Visit (INDEPENDENT_AMBULATORY_CARE_PROVIDER_SITE_OTHER): Payer: Medicare Other | Admitting: Physician Assistant

## 2014-12-17 VITALS — BP 148/70 | HR 67 | Ht 61.0 in | Wt 142.3 lb

## 2014-12-17 DIAGNOSIS — I10 Essential (primary) hypertension: Secondary | ICD-10-CM | POA: Diagnosis not present

## 2014-12-17 DIAGNOSIS — R0602 Shortness of breath: Secondary | ICD-10-CM | POA: Diagnosis not present

## 2014-12-17 DIAGNOSIS — Z79899 Other long term (current) drug therapy: Secondary | ICD-10-CM

## 2014-12-17 DIAGNOSIS — R0600 Dyspnea, unspecified: Secondary | ICD-10-CM

## 2014-12-17 DIAGNOSIS — J984 Other disorders of lung: Secondary | ICD-10-CM | POA: Diagnosis not present

## 2014-12-17 DIAGNOSIS — J929 Pleural plaque without asbestos: Secondary | ICD-10-CM | POA: Diagnosis not present

## 2014-12-17 LAB — CBC
HCT: 38.7 % (ref 36.0–46.0)
Hemoglobin: 13.1 g/dL (ref 12.0–15.0)
MCH: 30.5 pg (ref 26.0–34.0)
MCHC: 33.9 g/dL (ref 30.0–36.0)
MCV: 90 fL (ref 78.0–100.0)
MPV: 9.1 fL (ref 8.6–12.4)
Platelets: 287 10*3/uL (ref 150–400)
RBC: 4.3 MIL/uL (ref 3.87–5.11)
RDW: 13.8 % (ref 11.5–15.5)
WBC: 6.1 10*3/uL (ref 4.0–10.5)

## 2014-12-17 NOTE — Assessment & Plan Note (Signed)
Exam is unremarkable. She has no signs of acute heart failure exacerbation clear.  Ambulatory O2 saturations are appropriate in the 96-97%.  When she returned from walking sheet indicates short in a particular region left lateral upper chest.  She did not say she was having chest pain. Check a CBC although I suspect a normal. Will check chest x-ray and we will schedule a Lexiscan Myoview which was originally recommended 2 years ago by Dr. Royann Shiversroitoru.  EKG shows a left anterior fascicular block but otherwise normal.

## 2014-12-17 NOTE — Assessment & Plan Note (Signed)
Blood pressures mildly elevated. Her blood pressure during her physical in September was 130/78. She is on hydrochlorothiazide. No changes today.

## 2014-12-17 NOTE — Patient Instructions (Signed)
Your physician recommends that you return for lab work  A chest x-ray takes a picture of the organs and structures inside the chest, including the heart, lungs, and blood vessels. This test can show several things, including, whether the heart is enlarges; whether fluid is building up in the lungs; and whether pacemaker / defibrillator leads are still in place.  Your physician has requested that you have a lexiscan myoview. For further information please visit [email or web address]. Please follow instruction sheet, as given.   Your physician recommends that you schedule a follow-up appointment with Dr.Croitoru in his first available spot.

## 2014-12-17 NOTE — Progress Notes (Signed)
Patient ID: Deanna Meyers, female   DOB: Oct 01, 1921, 81 y.o.   MRN: [id number]    Date:  12/17/2014   ID:  Deanna Colestelle H Reitter, DOB Oct 01, 1921, MRN [id number]  PCP:  Hadley PenOBBINS,ROBERT A, MD  Primary Cardiologist:  Croitoru  Chief Complaint  Patient presents with  . Shortness of Breath    dyspnea with minimal activity. swelling in legs and feet on occasion.     History of Present Illness: Deanna Colestelle H Kretchmer is a 81 y.o. female with a history of hypertension, hyperlipidemia, peripheral vascular disease, pneumonia last January 2015, Cholecystectomy 11/12/2013. She was last seen by Dr. Royann Shiversroitoru in August 2014.  At that time she was complaining of shortness of breath.  Ejection fraction was normal with impaired diastolic function and essentially normal valvular function.  Pulmonary function tests and chest x-ray did not really identify a clear etiology for her dyspnea.  She did not undergo Lexiscan Myoview which was recommended.    Patient presents today for follow-up of dyspnea.  She notes worsening dyspnea with exertion and her daughters notice it even more.  She does not have any orthopnea, PND, LEE, CP or wheezing.  Her weight is stable.  We walked her around the office and her O2 sats were 96-97%.  The patient currently denies nausea, vomiting, fever, palpitations, cough, congestion, abdominal pain, hematochezia, melena, claudication.  Wt Readings from Last 3 Encounters:  12/17/14 142 lb 4.8 oz (64.547 kg)  07/12/13 141 lb 1.6 oz (64.003 kg)  06/13/13 142 lb 6.4 oz (64.592 kg)     Past Medical History  Diagnosis Date  . Discoloration of skin of toe 08/30/2011    LA doppler - bilateral ABI's - no evidence of arterial insufficiency; bilateral PVRs - normal waveforms; bilateral toe pressures - mod/severe perfusion for healing  . Hypertension   . PVD (peripheral vascular disease)     decreased blood flow to toes  . Hyperlipidemia     Current Outpatient Prescriptions  Medication Sig  Dispense Refill  . clopidogrel (PLAVIX) 75 MG tablet Take 75 mg by mouth daily.    Marland Kitchen. donepezil (ARICEPT) 23 MG TABS tablet Take 23 mg by mouth at bedtime.    . hydrochlorothiazide (HYDRODIURIL) 25 MG tablet Take 25 mg by mouth daily.    Marland Kitchen. latanoprost (XALATAN) 0.005 % ophthalmic solution Place 1 drop into the right eye daily.   11  . simvastatin (ZOCOR) 20 MG tablet Take 20 mg by mouth every evening.     No current facility-administered medications for this visit.    Allergies:   No Known Allergies  Social History:  The patient  reports that she has never smoked. She does not have any smokeless tobacco history on file. She reports that she does not drink alcohol or use illicit drugs.   Family history:  History reviewed. No pertinent family history.  ROS:  Please see the history of present illness.  All other systems reviewed and negative.   PHYSICAL EXAM: VS:  BP 148/70 mmHg  Pulse 67  Ht 5\' 1"  (1.549 m)  Wt 142 lb 4.8 oz (64.547 kg)  BMI 26.90 kg/m2 Well nourished, well developed, in no acute distress HEENT: Pupils are equal round react to light accommodation extraocular movements are intact.  Neck: no JVDNo cervical lymphadenopathy. Cardiac: Regular rate and rhythm without murmurs rubs or gallops. Lungs:  clear to auscultation bilaterally, no wheezing, rhonchi or rales Abd: soft, nontender, positive bowel sounds all quadrants, no hepatosplenomegaly Ext: no lower extremity edema.  2+ radial and dorsalis pedis pulses. Skin: warm and dry.  Patchy areas of bright erythema on her lower extremities worse on the left. Neuro:  Grossly normal  EKG:  Normal sinus rhythm rate 67 bpm left anterior fascicular block   ASSESSMENT AND PLAN:  Problem List Items Addressed This Visit    Essential hypertension    Blood pressures mildly elevated. Her blood pressure during her physical in September was 130/78. She is on hydrochlorothiazide. No changes today.      Dyspnea - Primary    Exam is  unremarkable. She has no signs of acute heart failure exacerbation clear.  Ambulatory O2 saturations are appropriate in the 96-97%.  When she returned from walking sheet indicates short in a particular region left lateral upper chest.  She did not say she was having chest pain. Check a CBC although I suspect a normal. Will check chest x-ray and we will schedule a Lexiscan Myoview which was originally recommended 2 years ago by Dr. Royann Shivers.  EKG shows a left anterior fascicular block but otherwise normal.      Relevant Orders   EKG 12-Lead   DG Chest 2 View   Myocardial Perfusion Imaging    Other Visit Diagnoses    Polypharmacy        Relevant Orders    CBC

## 2014-12-19 ENCOUNTER — Telehealth (HOSPITAL_COMMUNITY): Payer: Self-pay

## 2014-12-19 NOTE — Telephone Encounter (Signed)
Encounter complete. 

## 2014-12-25 ENCOUNTER — Ambulatory Visit (HOSPITAL_COMMUNITY)
Admission: RE | Admit: 2014-12-25 | Discharge: 2014-12-25 | Disposition: A | Discharge status: Home / Self Care | Payer: Medicare Other | Source: Ambulatory Visit | Attending: Physician Assistant | Admitting: Physician Assistant

## 2014-12-25 DIAGNOSIS — I10 Essential (primary) hypertension: Secondary | ICD-10-CM | POA: Diagnosis not present

## 2014-12-25 DIAGNOSIS — E785 Hyperlipidemia, unspecified: Secondary | ICD-10-CM | POA: Insufficient documentation

## 2014-12-25 DIAGNOSIS — R42 Dizziness and giddiness: Secondary | ICD-10-CM | POA: Insufficient documentation

## 2014-12-25 DIAGNOSIS — R0600 Dyspnea, unspecified: Secondary | ICD-10-CM

## 2014-12-25 DIAGNOSIS — R0609 Other forms of dyspnea: Secondary | ICD-10-CM | POA: Diagnosis not present

## 2014-12-25 DIAGNOSIS — I739 Peripheral vascular disease, unspecified: Secondary | ICD-10-CM | POA: Insufficient documentation

## 2014-12-25 DIAGNOSIS — Z8249 Family history of ischemic heart disease and other diseases of the circulatory system: Secondary | ICD-10-CM | POA: Insufficient documentation

## 2014-12-25 MED ORDER — TECHNETIUM TC 99M SESTAMIBI GENERIC - CARDIOLITE
10.7000 | Freq: Once | INTRAVENOUS | Status: AC | PRN
  Administered 2014-12-25: 11 via INTRAVENOUS

## 2014-12-25 MED ORDER — REGADENOSON 0.4 MG/5ML IV SOLN
0.4000 mg | Freq: Once | INTRAVENOUS | Status: AC
  Administered 2014-12-25: 0.4 mg via INTRAVENOUS

## 2014-12-25 MED ORDER — TECHNETIUM TC 99M SESTAMIBI GENERIC - CARDIOLITE
31.9000 | Freq: Once | INTRAVENOUS | Status: AC | PRN
  Administered 2014-12-25: 31.9 via INTRAVENOUS

## 2014-12-25 NOTE — Procedures (Addendum)
[facility] [address] [place] [address] [phone]  Cardiology Nuclear Med Study  Deanna Meyers is a 81 y.o. female     MRN : [id number]     DOB: Jun 05, 1921  Procedure Date: 12/25/2014  Nuclear Med Background Indication for Stress Test:  Evaluation for Ischemia History:  No prior cardiac or respiratory history reported;No prior NUC MPI for comparison. Cardiac Risk Factors: Family History - CAD, Hypertension, Lipids and PVD  Symptoms:  Dizziness, DOE, Light-Headedness and SOB   Nuclear Pre-Procedure Caffeine/Decaff Intake:  1:00am NPO After: 11am   IV Site: R Forearm  IV 0.9% NS with Angio Cath:  22g  Chest Size (in):  n/a IV Started by: Berdie OgrenAmanda Wease, RN  Height: 5\' 1"  (1.549 m)  Cup Size: A  BMI:  Body mass index is 26.84 kg/(m^2). Weight:  142 lb (64.411 kg)   Tech Comments:  n/a    Nuclear Med Study 1 or 2 day study: 1 day  Stress Test Type:  Lexiscan  Order Authorizing Provider:  Thurmon FairMihai Croitoru, MD   Resting Radionuclide: Technetium 6186m Sestamibi  Resting Radionuclide Dose: 10.7 mCi   Stress Radionuclide:  Technetium 4586m Sestamibi  Stress Radionuclide Dose: 31.9 mCi           Stress Protocol Rest HR: 58 Stress HR: 70  Rest BP: 126/75 Stress BP: 143/64  Exercise Time (min): n/a METS: n/a          Dose of Adenosine (mg):  n/a Dose of Lexiscan: 0.4 mg  Dose of Atropine (mg): n/a Dose of Dobutamine: n/a mcg/kg/min (at max HR)  Stress Test Technologist: Ernestene MentionGwen Farrington, CCT Nuclear Technologist: Gonzella LexPam Phillips, CNMT   Rest Procedure:  Myocardial perfusion imaging was performed at rest 45 minutes following the intravenous administration of Technetium 786m Sestamibi. Stress Procedure:  The patient received IV Lexiscan 0.4 mg over 15-seconds.  Technetium 7486m Sestamibi injected IV at 30-seconds.  There were no significant changes with Lexiscan.  Quantitative spect images were obtained after a 45 minute  delay.  Transient Ischemic Dilatation (Normal <1.22):  1.24  QGS EDV:  66 ml QGS ESV:  17 ml LV Ejection Fraction: 74%     Rest ECG: NSR, small Q in I ,aVL; LVH  Stress ECG: No significant change from baseline ECG  QPS Raw Data Images:  Normal; no motion artifact; normal heart/lung ratio. Stress Images:  Normal homogeneous uptake in all areas of the myocardium. Rest Images:  Normal homogeneous uptake in all areas of the myocardium. Subtraction (SDS):  Normal  Impression Exercise Capacity:  Lexiscan with no exercise. BP Response:  Normal blood pressure response. Clinical Symptoms:  No symptoms. ECG Impression:  No significant ST segment change suggestive of ischemia. Comparison with Prior Nuclear Study: No images to compare  Overall Impression:  Normal stress nuclear study.  LV Wall Motion:  NL LV Function, EF 74%; NL Wall Motion   KELLY,THOMAS A, MD  12/25/2014 6:08 PM

## 2015-01-02 DIAGNOSIS — H3531 Nonexudative age-related macular degeneration: Secondary | ICD-10-CM | POA: Diagnosis not present

## 2015-01-02 DIAGNOSIS — H3532 Exudative age-related macular degeneration: Secondary | ICD-10-CM | POA: Diagnosis not present

## 2015-01-07 ENCOUNTER — Telehealth: Payer: Self-pay | Admitting: *Deleted

## 2015-01-07 DIAGNOSIS — R0600 Dyspnea, unspecified: Secondary | ICD-10-CM

## 2015-01-07 NOTE — Telephone Encounter (Signed)
Yes, please refer to pulmonary.  Deanna FinlayBryan Mikayah Joy, [facility]

## 2015-01-07 NOTE — Telephone Encounter (Signed)
Deanna Meyers had requested her mother be referred to pulmonologist for assessment. Seen by Wilburt FinlayBryan Hager on 1/19.  This is in regards to advisement of next steps following her normal stress test on the 27th.

## 2015-01-10 NOTE — Telephone Encounter (Signed)
Ambulatory referral signed.

## 2015-01-23 ENCOUNTER — Ambulatory Visit: Payer: Medicare Other | Admitting: Cardiovascular Disease

## 2015-02-18 ENCOUNTER — Encounter: Payer: Self-pay | Admitting: Pulmonary Disease

## 2015-02-18 ENCOUNTER — Ambulatory Visit (INDEPENDENT_AMBULATORY_CARE_PROVIDER_SITE_OTHER): Payer: Medicare Other | Admitting: Pulmonary Disease

## 2015-02-18 VITALS — BP 132/78 | HR 60 | Temp 98.0°F | Ht 63.0 in | Wt 144.2 lb

## 2015-02-18 DIAGNOSIS — R0600 Dyspnea, unspecified: Secondary | ICD-10-CM

## 2015-02-18 NOTE — Patient Instructions (Signed)
Will schedule for breathing studies, and see you back on same day to review results.

## 2015-02-18 NOTE — Progress Notes (Signed)
   Subjective:    Patient ID: Deanna Meyers, female    DOB: 14-Jul-1921, 81 y.o.   MRN: [id number]  HPI The patient is a 81 year old female who I've been asked to see for dyspnea on exertion. She and her daughter tell me that she has had progressive dyspnea over the last 8 months, and now is occurring even at rest or just walking slowly through the house. She has had no significant cough or congestion, and no mucus production. She has never smoked, and has no history of asthma. She has had an issue with chronic ongoing lower extremity edema. The patient had PFTs in 2014 that showed no airflow obstruction and minimal change in her diffusion capacity, but did have a decreased forced vital capacity. Unable to do lung volumes because of leaks. She also had an echo at that time that showed diastolic dysfunction, mild mitral regurgitation, and marked left atrial enlargement. She has had a nuclear stress test in January of this year that was normal. She is also had a chest x-ray this year that showed kyphosis, and also some chronic changes in her upper lung zones that been present at least until 2014 and without significant change.   Review of Systems  Constitutional: Negative for fever, chills and unexpected weight change.  HENT: Negative for congestion, dental problem, ear pain, nosebleeds, postnasal drip, rhinorrhea, sinus pressure, sneezing, sore throat, trouble swallowing and voice change.   Eyes: Negative for redness, itching and visual disturbance.  Respiratory: Positive for shortness of breath. Negative for cough, choking, chest tightness and wheezing.   Cardiovascular: Positive for leg swelling. Negative for chest pain and palpitations.  Gastrointestinal: Negative for nausea, vomiting, abdominal pain and diarrhea.  Genitourinary: Negative for dysuria and difficulty urinating.  Musculoskeletal: Negative for joint swelling and arthralgias.  Skin: Negative for rash.  Neurological: Negative for  tremors, syncope and headaches.  Hematological: Does not bruise/bleed easily.  Psychiatric/Behavioral: Negative for dysphoric mood. The patient is not nervous/anxious.        Objective:   Physical Exam Constitutional:  Frail appearing female, no acute distress  HENT:  Nares patent without discharge  Oropharynx without exudate, palate and uvula are normal  Eyes:  Perrla, eomi, no scleral icterus  Neck:  No JVD, no TMG  Cardiovascular:  Normal rate, regular rhythm, no rubs or gallops.  No murmurs        Intact distal pulses but diminished  Pulmonary :  Normal breath sounds, no stridor or respiratory distress   No rales, rhonchi, or wheezing  Abdominal:  Soft, nondistended, bowel sounds present.  No tenderness noted.   Musculoskeletal:  1+ lower extremity edema noted.  Lymph Nodes:  No cervical lymphadenopathy noted  Skin:  No cyanosis noted  Neurologic:  Alert, appropriate, moves all 4 extremities without obvious deficit.         Assessment & Plan:

## 2015-02-18 NOTE — Assessment & Plan Note (Signed)
The patient has progressive dyspnea on exertion over the last 8 months, that now has gotten to the point where she has shortness of breath even at rest. She has no history of asthma or other pulmonary disease, and her PFTs in 2014 did not show airflow obstruction and minimal change in her diffusion capacity. She was unable to do lung volumes at that time because of leaks, but did have a decreased FVC suggestive of restriction. She does have kyphosis on exam and her chest x-ray, and also has some chronic scarring in her upper lobes which dates back to at least 2014 with no significant change since that time. The patient also has had an echo in 2014 that showed diastolic dysfunction, and marked left atrial enlargement but only mild mitral regurgitation. I wonder if the echo is underestimating her degree of mitral valve disease. I will arrange for follow-up pulmonary function studies, but if they are not overly impressive, I would recommend a follow-up echocardiogram to look at her diastolic dysfunction and mitral valve. I also think that deconditioning and debility or playing a role here as well.

## 2015-02-19 ENCOUNTER — Telehealth: Payer: Self-pay | Admitting: *Deleted

## 2015-02-19 DIAGNOSIS — I340 Nonrheumatic mitral (valve) insufficiency: Secondary | ICD-10-CM

## 2015-02-19 NOTE — Telephone Encounter (Signed)
Dr. Shelle Ironlance requested an Echo be done to evaluate MR and heart function due to increasing dyspnea.  Order placed and will have scheduling arrange and contact the patient.

## 2015-02-19 NOTE — Telephone Encounter (Signed)
-----   Message from Thurmon FairMihai Croitoru, MD sent at 02/18/2015  3:26 PM EDT ----- Britta MccreedyBarbara, please order an echo for MR

## 2015-02-24 ENCOUNTER — Ambulatory Visit (INDEPENDENT_AMBULATORY_CARE_PROVIDER_SITE_OTHER): Payer: Medicare Other | Admitting: Pulmonary Disease

## 2015-02-24 ENCOUNTER — Ambulatory Visit (HOSPITAL_BASED_OUTPATIENT_CLINIC_OR_DEPARTMENT_OTHER)
Admission: RE | Admit: 2015-02-24 | Discharge: 2015-02-24 | Disposition: A | Discharge status: Home / Self Care | Payer: Medicare Other | Source: Ambulatory Visit | Attending: Cardiovascular Disease | Admitting: Cardiovascular Disease

## 2015-02-24 ENCOUNTER — Other Ambulatory Visit (INDEPENDENT_AMBULATORY_CARE_PROVIDER_SITE_OTHER): Payer: Medicare Other

## 2015-02-24 ENCOUNTER — Ambulatory Visit (HOSPITAL_COMMUNITY)
Admission: RE | Admit: 2015-02-24 | Discharge: 2015-02-24 | Disposition: A | Discharge status: Home / Self Care | Payer: Medicare Other | Source: Ambulatory Visit | Attending: Pulmonary Disease | Admitting: Pulmonary Disease

## 2015-02-24 ENCOUNTER — Encounter: Payer: Self-pay | Admitting: Pulmonary Disease

## 2015-02-24 VITALS — BP 124/70 | HR 64 | Temp 97.0°F | Ht 64.0 in | Wt 147.4 lb

## 2015-02-24 DIAGNOSIS — R0600 Dyspnea, unspecified: Secondary | ICD-10-CM

## 2015-02-24 DIAGNOSIS — I340 Nonrheumatic mitral (valve) insufficiency: Secondary | ICD-10-CM

## 2015-02-24 DIAGNOSIS — I358 Other nonrheumatic aortic valve disorders: Secondary | ICD-10-CM | POA: Insufficient documentation

## 2015-02-24 LAB — PULMONARY FUNCTION TEST
DL/VA % pred: 69 %
DL/VA: 3.32 ml/min/mmHg/L
DLCO unc % pred: 33 %
DLCO unc: 8.19 ml/min/mmHg
FEF 25-75 Post: 1.88 L/sec
FEF 25-75 Pre: 1.36 L/sec
FEF2575-%Change-Post: 38 %
FEF2575-%Pred-Post: 241 %
FEF2575-%Pred-Pre: 174 %
FEV1-%Change-Post: 5 %
FEV1-%Pred-Post: 97 %
FEV1-%Pred-Pre: 92 %
FEV1-Post: 1.47 L
FEV1-Pre: 1.39 L
FEV1FVC-%Change-Post: 9 %
FEV1FVC-%Pred-Pre: 114 %
FEV6-%Change-Post: -2 %
FEV6-%Pred-Post: 86 %
FEV6-%Pred-Pre: 88 %
FEV6-Post: 1.65 L
FEV6-Pre: 1.69 L
FEV6FVC-%Change-Post: 1 %
FEV6FVC-%Pred-Post: 107 %
FEV6FVC-%Pred-Pre: 106 %
FVC-%Change-Post: -3 %
FVC-%Pred-Post: 80 %
FVC-%Pred-Pre: 83 %
FVC-Post: 1.65 L
FVC-Pre: 1.71 L
Post FEV1/FVC ratio: 89 %
Post FEV6/FVC ratio: 100 %
Pre FEV1/FVC ratio: 81 %
Pre FEV6/FVC Ratio: 99 %
RV % pred: 58 %
RV: 1.55 L
TLC % pred: 63 %
TLC: 3.2 L

## 2015-02-24 LAB — BASIC METABOLIC PANEL
BUN: 16 mg/dL (ref 6–23)
CO2: 33 mEq/L — ABNORMAL HIGH (ref 19–32)
Calcium: 9.2 mg/dL (ref 8.4–10.5)
Chloride: 100 mEq/L (ref 96–112)
Creatinine, Ser: 0.81 mg/dL (ref 0.40–1.20)
GFR: 70.32 mL/min (ref >60.00)
Glucose, Bld: 139 mg/dL — ABNORMAL HIGH (ref 70–99)
Potassium: 3.6 mEq/L (ref 3.5–5.1)
Sodium: 137 mEq/L (ref 135–145)

## 2015-02-24 MED ORDER — ALBUTEROL SULFATE (2.5 MG/3ML) 0.083% IN NEBU
2.5000 mg | INHALATION_SOLUTION | Freq: Once | RESPIRATORY_TRACT | Status: AC
  Administered 2015-02-24: 2.5 mg via RESPIRATORY_TRACT

## 2015-02-24 NOTE — Progress Notes (Addendum)
2D Echocardiogram Complete.  02/24/2015   Rynell Ciotti, RDCS   Tech Notes:  Mrs. Deanna Meyers became extremely short of breath upon rolling to the left lateral decubitus position.  These symptoms were relieved with head elevation.  She states this is common at home, and has been worsening over the last 6 months or so.    Preliminary Technician Findings:  There is a possible small Pericardial Effusion (vs. fat pad) present anterior to the Right Ventricle.  Unable to visualize in the subcostal view, however it does not seem to be collapsing the atria or ventricle in other views.

## 2015-02-24 NOTE — Assessment & Plan Note (Signed)
The patient continues to have significant dyspnea on exertion, and her PFTs done today shows no airflow obstruction. However, she was unable to do the lung volumes or DLCO maneuver adequately to get accurate testing. She does have some chronic scarring on her chest x-ray that has been long-standing, and I think it is worthwhile to do a CT chest to further evaluate this. We can also exclude pulmonary emboli at the same time. She has had a follow-up echo that shows evidence for left sided pressure overload, but no significant valvular abnormality. She also had great difficulty lying flat for the procedure.

## 2015-02-24 NOTE — Patient Instructions (Signed)
Will schedule for scan of chest to evaluate your scarring, and also to make sure you do not have blood clots in your lungs.  Will call once results are available.

## 2015-02-24 NOTE — Progress Notes (Signed)
   Subjective:    Patient ID: Deanna Meyers, female    DOB: Oct 04, 1921, 81 y.o.   MRN: [id number]  HPI The patient comes in today for follow-up of her complaint of dyspnea on exertion. She has had pulmonary function studies today that showed no airflow obstruction, but she was not able to adequately do lung volumes or diffusion capacity. I have also gone back and reviewed her chest x-rays, and there has been some chronic scarring primarily in the upper lung zones. This has never been evaluated with a CT chest.   Review of Systems  Constitutional: Negative for fever and unexpected weight change.  HENT: Positive for congestion and postnasal drip. Negative for dental problem, ear pain, nosebleeds, rhinorrhea, sinus pressure, sneezing, sore throat and trouble swallowing.   Eyes: Negative for redness and itching.  Respiratory: Positive for cough, shortness of breath and wheezing. Negative for chest tightness.   Cardiovascular: Negative for palpitations and leg swelling.  Gastrointestinal: Negative for nausea and vomiting.  Genitourinary: Negative for dysuria.  Musculoskeletal: Negative for joint swelling.  Skin: Negative for rash.  Neurological: Negative for headaches.  Hematological: Does not bruise/bleed easily.  Psychiatric/Behavioral: Negative for dysphoric mood. The patient is not nervous/anxious.        Objective:   Physical Exam Frail-appearing female in no acute distress at rest. Nose without purulence or discharge noted Neck without lymphadenopathy or thyromegaly Lower extremities with edema and varicosities noted Alert and oriented, moves all 4 extremities.       Assessment & Plan:

## 2015-02-25 ENCOUNTER — Telehealth: Payer: Self-pay | Admitting: *Deleted

## 2015-02-25 ENCOUNTER — Ambulatory Visit (INDEPENDENT_AMBULATORY_CARE_PROVIDER_SITE_OTHER)
Admission: RE | Admit: 2015-02-25 | Discharge: 2015-02-25 | Disposition: A | Discharge status: Home / Self Care | Payer: Medicare Other | Source: Ambulatory Visit | Attending: Pulmonary Disease | Admitting: Pulmonary Disease

## 2015-02-25 DIAGNOSIS — R0600 Dyspnea, unspecified: Secondary | ICD-10-CM

## 2015-02-25 DIAGNOSIS — J479 Bronchiectasis, uncomplicated: Secondary | ICD-10-CM | POA: Diagnosis not present

## 2015-02-25 DIAGNOSIS — I77810 Thoracic aortic ectasia: Secondary | ICD-10-CM | POA: Diagnosis not present

## 2015-02-25 DIAGNOSIS — I2510 Atherosclerotic heart disease of native coronary artery without angina pectoris: Secondary | ICD-10-CM | POA: Diagnosis not present

## 2015-02-25 DIAGNOSIS — Z79899 Other long term (current) drug therapy: Secondary | ICD-10-CM

## 2015-02-25 MED ORDER — IOHEXOL 350 MG/ML SOLN
75.0000 mL | Freq: Once | INTRAVENOUS | Status: AC | PRN
  Administered 2015-02-25: 75 mL via INTRAVENOUS

## 2015-02-25 NOTE — Telephone Encounter (Signed)
LM with echo results and asked her to take her mother to get labs drawn and to call and make an appt with Dr. Salena Saner.  Asked that she call to verify she got this message.  Order placed for labs and [facility] to schedule next available appt with Dr. Salena Saner.  BMP done 02/24/15 through Dr. Teddy Spikelance's lab.

## 2015-02-25 NOTE — Progress Notes (Signed)
Talked with daughter Dois DavenportSandra - Dr. Salena Saner suggests she have some blood work done.  Dr. Shelle Ironlance did one of the tests yesterday at his office but not the BNP.  States she had a breathing test and chest CT yesterday and today and Dr. Shelle Ironlance is to call with results.  States her mother does not want to go through any more testing. States she is extremely fatigued by all the testing and feels at her age not sure what she would do if anything was found.  Asked that I speak with her sister Rosey Batheresa who works here and they will decide( but has already left for the day will talk with her in the AM).

## 2015-02-25 NOTE — Telephone Encounter (Signed)
-----   Message from Thurmon FairMihai Croitoru, MD sent at 02/24/2015  1:16 PM EDT ----- Dr. Shelle Ironlance had been concerned that she may have mitral regurgitation, but the echo shows virtually none. There is echo suggestion of elevated filling pressures due to diastolic dysfunction. Can we please check BMET and BNP? Does she have a f/u appointment here?

## 2015-02-26 ENCOUNTER — Telehealth: Payer: Self-pay

## 2015-02-26 DIAGNOSIS — I10 Essential (primary) hypertension: Secondary | ICD-10-CM

## 2015-02-26 DIAGNOSIS — R0600 Dyspnea, unspecified: Secondary | ICD-10-CM

## 2015-02-26 NOTE — Telephone Encounter (Signed)
Spoke to patient's daughter she stated mother needed BNP and would like [place].BNP order faxed to Dr.Robert Sherral [place] at fax # [phone].

## 2015-02-28 DIAGNOSIS — R0602 Shortness of breath: Secondary | ICD-10-CM | POA: Diagnosis not present

## 2015-03-04 ENCOUNTER — Telehealth: Payer: Self-pay | Admitting: *Deleted

## 2015-03-04 NOTE — Telephone Encounter (Signed)
BNP results given to daughter.

## 2015-03-05 ENCOUNTER — Encounter: Payer: Self-pay | Admitting: Cardiovascular Disease

## 2015-05-02 DIAGNOSIS — H3532 Exudative age-related macular degeneration: Secondary | ICD-10-CM | POA: Diagnosis not present

## 2015-08-17 DIAGNOSIS — F0390 Unspecified dementia without behavioral disturbance: Secondary | ICD-10-CM | POA: Diagnosis not present

## 2015-08-17 DIAGNOSIS — R55 Syncope and collapse: Secondary | ICD-10-CM | POA: Diagnosis not present

## 2015-08-17 DIAGNOSIS — R0602 Shortness of breath: Secondary | ICD-10-CM | POA: Diagnosis not present

## 2015-08-17 DIAGNOSIS — E780 Pure hypercholesterolemia: Secondary | ICD-10-CM | POA: Diagnosis not present

## 2015-08-18 DIAGNOSIS — Z0000 Encounter for general adult medical examination without abnormal findings: Secondary | ICD-10-CM | POA: Diagnosis not present

## 2015-08-18 DIAGNOSIS — I739 Peripheral vascular disease, unspecified: Secondary | ICD-10-CM | POA: Diagnosis not present

## 2015-08-18 DIAGNOSIS — I10 Essential (primary) hypertension: Secondary | ICD-10-CM | POA: Diagnosis not present

## 2015-08-18 DIAGNOSIS — Z23 Encounter for immunization: Secondary | ICD-10-CM | POA: Diagnosis not present

## 2015-08-18 DIAGNOSIS — R7309 Other abnormal glucose: Secondary | ICD-10-CM | POA: Diagnosis not present

## 2015-08-18 DIAGNOSIS — N3001 Acute cystitis with hematuria: Secondary | ICD-10-CM | POA: Diagnosis not present

## 2015-08-18 DIAGNOSIS — F0390 Unspecified dementia without behavioral disturbance: Secondary | ICD-10-CM | POA: Diagnosis not present

## 2015-08-18 DIAGNOSIS — E782 Mixed hyperlipidemia: Secondary | ICD-10-CM | POA: Diagnosis not present

## 2015-09-22 DIAGNOSIS — L908 Other atrophic disorders of skin: Secondary | ICD-10-CM | POA: Diagnosis not present

## 2015-09-22 DIAGNOSIS — H04123 Dry eye syndrome of bilateral lacrimal glands: Secondary | ICD-10-CM | POA: Diagnosis not present

## 2015-10-17 DIAGNOSIS — H401131 Primary open-angle glaucoma, bilateral, mild stage: Secondary | ICD-10-CM | POA: Diagnosis not present

## 2015-11-04 DIAGNOSIS — R001 Bradycardia, unspecified: Secondary | ICD-10-CM | POA: Diagnosis not present

## 2015-11-04 DIAGNOSIS — I10 Essential (primary) hypertension: Secondary | ICD-10-CM | POA: Diagnosis present

## 2015-11-04 DIAGNOSIS — N3000 Acute cystitis without hematuria: Secondary | ICD-10-CM | POA: Diagnosis not present

## 2015-11-04 DIAGNOSIS — F0390 Unspecified dementia without behavioral disturbance: Secondary | ICD-10-CM | POA: Diagnosis not present

## 2015-11-04 DIAGNOSIS — R112 Nausea with vomiting, unspecified: Secondary | ICD-10-CM | POA: Diagnosis not present

## 2015-11-04 DIAGNOSIS — R531 Weakness: Secondary | ICD-10-CM | POA: Diagnosis not present

## 2015-11-04 DIAGNOSIS — R0602 Shortness of breath: Secondary | ICD-10-CM | POA: Diagnosis not present

## 2015-11-04 DIAGNOSIS — Z79899 Other long term (current) drug therapy: Secondary | ICD-10-CM | POA: Diagnosis not present

## 2015-11-04 DIAGNOSIS — R109 Unspecified abdominal pain: Secondary | ICD-10-CM | POA: Diagnosis not present

## 2015-11-04 DIAGNOSIS — F0280 Dementia in other diseases classified elsewhere without behavioral disturbance: Secondary | ICD-10-CM | POA: Diagnosis not present

## 2015-11-04 DIAGNOSIS — Z87440 Personal history of urinary (tract) infections: Secondary | ICD-10-CM | POA: Diagnosis not present

## 2015-11-04 DIAGNOSIS — N390 Urinary tract infection, site not specified: Secondary | ICD-10-CM | POA: Diagnosis not present

## 2015-11-04 DIAGNOSIS — K5732 Diverticulitis of large intestine without perforation or abscess without bleeding: Secondary | ICD-10-CM | POA: Diagnosis not present

## 2015-11-04 DIAGNOSIS — E7800 Pure hypercholesterolemia, unspecified: Secondary | ICD-10-CM | POA: Diagnosis present

## 2015-11-04 DIAGNOSIS — G301 Alzheimer's disease with late onset: Secondary | ICD-10-CM | POA: Diagnosis not present

## 2015-11-04 DIAGNOSIS — K5792 Diverticulitis of intestine, part unspecified, without perforation or abscess without bleeding: Secondary | ICD-10-CM | POA: Diagnosis not present

## 2015-11-11 DIAGNOSIS — G301 Alzheimer's disease with late onset: Secondary | ICD-10-CM | POA: Diagnosis not present

## 2015-11-11 DIAGNOSIS — F329 Major depressive disorder, single episode, unspecified: Secondary | ICD-10-CM | POA: Diagnosis not present

## 2015-11-11 DIAGNOSIS — Z9181 History of falling: Secondary | ICD-10-CM | POA: Diagnosis not present

## 2015-11-11 DIAGNOSIS — Z602 Problems related to living alone: Secondary | ICD-10-CM | POA: Diagnosis not present

## 2015-11-11 DIAGNOSIS — R2681 Unsteadiness on feet: Secondary | ICD-10-CM | POA: Diagnosis not present

## 2015-11-11 DIAGNOSIS — F0280 Dementia in other diseases classified elsewhere without behavioral disturbance: Secondary | ICD-10-CM | POA: Diagnosis not present

## 2015-11-11 DIAGNOSIS — Z7902 Long term (current) use of antithrombotics/antiplatelets: Secondary | ICD-10-CM | POA: Diagnosis not present

## 2015-11-11 DIAGNOSIS — I10 Essential (primary) hypertension: Secondary | ICD-10-CM | POA: Diagnosis not present

## 2015-11-11 DIAGNOSIS — Z87440 Personal history of urinary (tract) infections: Secondary | ICD-10-CM | POA: Diagnosis not present

## 2015-11-13 DIAGNOSIS — N390 Urinary tract infection, site not specified: Secondary | ICD-10-CM | POA: Diagnosis not present

## 2015-11-13 DIAGNOSIS — F0390 Unspecified dementia without behavioral disturbance: Secondary | ICD-10-CM | POA: Diagnosis not present

## 2015-11-13 DIAGNOSIS — I10 Essential (primary) hypertension: Secondary | ICD-10-CM | POA: Diagnosis not present

## 2015-11-13 DIAGNOSIS — E860 Dehydration: Secondary | ICD-10-CM | POA: Diagnosis not present

## 2015-11-14 DIAGNOSIS — I10 Essential (primary) hypertension: Secondary | ICD-10-CM | POA: Diagnosis not present

## 2015-11-14 DIAGNOSIS — F0280 Dementia in other diseases classified elsewhere without behavioral disturbance: Secondary | ICD-10-CM | POA: Diagnosis not present

## 2015-11-14 DIAGNOSIS — Z87440 Personal history of urinary (tract) infections: Secondary | ICD-10-CM | POA: Diagnosis not present

## 2015-11-14 DIAGNOSIS — R2681 Unsteadiness on feet: Secondary | ICD-10-CM | POA: Diagnosis not present

## 2015-11-14 DIAGNOSIS — G301 Alzheimer's disease with late onset: Secondary | ICD-10-CM | POA: Diagnosis not present

## 2015-11-14 DIAGNOSIS — F329 Major depressive disorder, single episode, unspecified: Secondary | ICD-10-CM | POA: Diagnosis not present

## 2015-11-18 DIAGNOSIS — G301 Alzheimer's disease with late onset: Secondary | ICD-10-CM | POA: Diagnosis not present

## 2015-11-18 DIAGNOSIS — Z87440 Personal history of urinary (tract) infections: Secondary | ICD-10-CM | POA: Diagnosis not present

## 2015-11-18 DIAGNOSIS — I10 Essential (primary) hypertension: Secondary | ICD-10-CM | POA: Diagnosis not present

## 2015-11-18 DIAGNOSIS — F0280 Dementia in other diseases classified elsewhere without behavioral disturbance: Secondary | ICD-10-CM | POA: Diagnosis not present

## 2015-11-18 DIAGNOSIS — R2681 Unsteadiness on feet: Secondary | ICD-10-CM | POA: Diagnosis not present

## 2015-11-18 DIAGNOSIS — F329 Major depressive disorder, single episode, unspecified: Secondary | ICD-10-CM | POA: Diagnosis not present

## 2015-11-21 DIAGNOSIS — I10 Essential (primary) hypertension: Secondary | ICD-10-CM | POA: Diagnosis not present

## 2015-11-21 DIAGNOSIS — F0280 Dementia in other diseases classified elsewhere without behavioral disturbance: Secondary | ICD-10-CM | POA: Diagnosis not present

## 2015-11-21 DIAGNOSIS — G301 Alzheimer's disease with late onset: Secondary | ICD-10-CM | POA: Diagnosis not present

## 2015-11-21 DIAGNOSIS — F329 Major depressive disorder, single episode, unspecified: Secondary | ICD-10-CM | POA: Diagnosis not present

## 2015-11-21 DIAGNOSIS — Z87440 Personal history of urinary (tract) infections: Secondary | ICD-10-CM | POA: Diagnosis not present

## 2015-11-21 DIAGNOSIS — R2681 Unsteadiness on feet: Secondary | ICD-10-CM | POA: Diagnosis not present

## 2015-11-25 DIAGNOSIS — F329 Major depressive disorder, single episode, unspecified: Secondary | ICD-10-CM | POA: Diagnosis not present

## 2015-11-25 DIAGNOSIS — Z87440 Personal history of urinary (tract) infections: Secondary | ICD-10-CM | POA: Diagnosis not present

## 2015-11-25 DIAGNOSIS — R2681 Unsteadiness on feet: Secondary | ICD-10-CM | POA: Diagnosis not present

## 2015-11-25 DIAGNOSIS — I10 Essential (primary) hypertension: Secondary | ICD-10-CM | POA: Diagnosis not present

## 2015-11-25 DIAGNOSIS — F0280 Dementia in other diseases classified elsewhere without behavioral disturbance: Secondary | ICD-10-CM | POA: Diagnosis not present

## 2015-11-25 DIAGNOSIS — G301 Alzheimer's disease with late onset: Secondary | ICD-10-CM | POA: Diagnosis not present

## 2015-12-02 DIAGNOSIS — F329 Major depressive disorder, single episode, unspecified: Secondary | ICD-10-CM | POA: Diagnosis not present

## 2015-12-02 DIAGNOSIS — I10 Essential (primary) hypertension: Secondary | ICD-10-CM | POA: Diagnosis not present

## 2015-12-02 DIAGNOSIS — G301 Alzheimer's disease with late onset: Secondary | ICD-10-CM | POA: Diagnosis not present

## 2015-12-02 DIAGNOSIS — F0280 Dementia in other diseases classified elsewhere without behavioral disturbance: Secondary | ICD-10-CM | POA: Diagnosis not present

## 2015-12-02 DIAGNOSIS — Z87440 Personal history of urinary (tract) infections: Secondary | ICD-10-CM | POA: Diagnosis not present

## 2015-12-02 DIAGNOSIS — R2681 Unsteadiness on feet: Secondary | ICD-10-CM | POA: Diagnosis not present

## 2015-12-04 DIAGNOSIS — F329 Major depressive disorder, single episode, unspecified: Secondary | ICD-10-CM | POA: Diagnosis not present

## 2015-12-04 DIAGNOSIS — G301 Alzheimer's disease with late onset: Secondary | ICD-10-CM | POA: Diagnosis not present

## 2015-12-04 DIAGNOSIS — I10 Essential (primary) hypertension: Secondary | ICD-10-CM | POA: Diagnosis not present

## 2015-12-04 DIAGNOSIS — R2681 Unsteadiness on feet: Secondary | ICD-10-CM | POA: Diagnosis not present

## 2015-12-04 DIAGNOSIS — Z87440 Personal history of urinary (tract) infections: Secondary | ICD-10-CM | POA: Diagnosis not present

## 2015-12-04 DIAGNOSIS — F0280 Dementia in other diseases classified elsewhere without behavioral disturbance: Secondary | ICD-10-CM | POA: Diagnosis not present

## 2016-01-15 DIAGNOSIS — I10 Essential (primary) hypertension: Secondary | ICD-10-CM | POA: Diagnosis not present

## 2016-01-15 DIAGNOSIS — G301 Alzheimer's disease with late onset: Secondary | ICD-10-CM | POA: Diagnosis not present

## 2016-01-15 DIAGNOSIS — Z79899 Other long term (current) drug therapy: Secondary | ICD-10-CM | POA: Diagnosis not present

## 2016-01-15 DIAGNOSIS — Z87440 Personal history of urinary (tract) infections: Secondary | ICD-10-CM | POA: Diagnosis not present

## 2016-01-15 DIAGNOSIS — R29810 Facial weakness: Secondary | ICD-10-CM | POA: Diagnosis not present

## 2016-01-15 DIAGNOSIS — I517 Cardiomegaly: Secondary | ICD-10-CM | POA: Diagnosis not present

## 2016-01-15 DIAGNOSIS — R079 Chest pain, unspecified: Secondary | ICD-10-CM | POA: Diagnosis not present

## 2016-01-15 DIAGNOSIS — F0390 Unspecified dementia without behavioral disturbance: Secondary | ICD-10-CM | POA: Diagnosis not present

## 2016-01-15 DIAGNOSIS — F419 Anxiety disorder, unspecified: Secondary | ICD-10-CM | POA: Diagnosis present

## 2016-01-15 DIAGNOSIS — I63233 Cerebral infarction due to unspecified occlusion or stenosis of bilateral carotid arteries: Secondary | ICD-10-CM | POA: Diagnosis not present

## 2016-01-15 DIAGNOSIS — R200 Anesthesia of skin: Secondary | ICD-10-CM | POA: Diagnosis not present

## 2016-01-15 DIAGNOSIS — F0280 Dementia in other diseases classified elsewhere without behavioral disturbance: Secondary | ICD-10-CM | POA: Diagnosis not present

## 2016-01-15 DIAGNOSIS — I639 Cerebral infarction, unspecified: Secondary | ICD-10-CM | POA: Diagnosis not present

## 2016-01-15 DIAGNOSIS — G459 Transient cerebral ischemic attack, unspecified: Secondary | ICD-10-CM | POA: Diagnosis not present

## 2016-01-15 DIAGNOSIS — I083 Combined rheumatic disorders of mitral, aortic and tricuspid valves: Secondary | ICD-10-CM | POA: Diagnosis not present

## 2016-01-15 DIAGNOSIS — R001 Bradycardia, unspecified: Secondary | ICD-10-CM | POA: Diagnosis not present

## 2016-01-19 DIAGNOSIS — M6281 Muscle weakness (generalized): Secondary | ICD-10-CM | POA: Diagnosis not present

## 2016-01-30 DIAGNOSIS — I639 Cerebral infarction, unspecified: Secondary | ICD-10-CM | POA: Diagnosis not present

## 2016-01-30 DIAGNOSIS — R2681 Unsteadiness on feet: Secondary | ICD-10-CM | POA: Diagnosis not present

## 2016-01-30 DIAGNOSIS — E538 Deficiency of other specified B group vitamins: Secondary | ICD-10-CM | POA: Diagnosis not present

## 2016-01-30 DIAGNOSIS — G609 Hereditary and idiopathic neuropathy, unspecified: Secondary | ICD-10-CM | POA: Diagnosis not present

## 2016-01-30 DIAGNOSIS — G603 Idiopathic progressive neuropathy: Secondary | ICD-10-CM | POA: Diagnosis not present

## 2016-01-30 DIAGNOSIS — E531 Pyridoxine deficiency: Secondary | ICD-10-CM | POA: Diagnosis not present

## 2016-01-30 DIAGNOSIS — R760 Raised antibody titer: Secondary | ICD-10-CM | POA: Diagnosis not present

## 2016-02-11 DIAGNOSIS — H353131 Nonexudative age-related macular degeneration, bilateral, early dry stage: Secondary | ICD-10-CM | POA: Diagnosis not present

## 2016-02-11 DIAGNOSIS — H401131 Primary open-angle glaucoma, bilateral, mild stage: Secondary | ICD-10-CM | POA: Diagnosis not present

## 2016-02-17 DIAGNOSIS — M545 Low back pain: Secondary | ICD-10-CM | POA: Diagnosis not present

## 2016-02-17 DIAGNOSIS — I639 Cerebral infarction, unspecified: Secondary | ICD-10-CM | POA: Diagnosis not present

## 2016-02-17 DIAGNOSIS — M5417 Radiculopathy, lumbosacral region: Secondary | ICD-10-CM | POA: Diagnosis not present

## 2016-02-17 DIAGNOSIS — G603 Idiopathic progressive neuropathy: Secondary | ICD-10-CM | POA: Diagnosis not present

## 2016-02-17 DIAGNOSIS — E538 Deficiency of other specified B group vitamins: Secondary | ICD-10-CM | POA: Diagnosis not present

## 2016-04-06 DIAGNOSIS — N3090 Cystitis, unspecified without hematuria: Secondary | ICD-10-CM | POA: Diagnosis not present

## 2016-04-06 DIAGNOSIS — R5383 Other fatigue: Secondary | ICD-10-CM | POA: Diagnosis not present

## 2016-04-06 DIAGNOSIS — E860 Dehydration: Secondary | ICD-10-CM | POA: Diagnosis not present

## 2016-04-06 DIAGNOSIS — E538 Deficiency of other specified B group vitamins: Secondary | ICD-10-CM | POA: Diagnosis not present

## 2016-04-06 DIAGNOSIS — R5381 Other malaise: Secondary | ICD-10-CM | POA: Diagnosis not present

## 2016-04-06 DIAGNOSIS — S80811A Abrasion, right lower leg, initial encounter: Secondary | ICD-10-CM | POA: Diagnosis not present

## 2016-06-07 DIAGNOSIS — H353131 Nonexudative age-related macular degeneration, bilateral, early dry stage: Secondary | ICD-10-CM | POA: Diagnosis not present

## 2016-06-07 DIAGNOSIS — H26491 Other secondary cataract, right eye: Secondary | ICD-10-CM | POA: Diagnosis not present

## 2016-06-07 DIAGNOSIS — H401131 Primary open-angle glaucoma, bilateral, mild stage: Secondary | ICD-10-CM | POA: Diagnosis not present

## 2016-06-21 DIAGNOSIS — R358 Other polyuria: Secondary | ICD-10-CM | POA: Diagnosis not present

## 2016-06-21 DIAGNOSIS — L0100 Impetigo, unspecified: Secondary | ICD-10-CM | POA: Diagnosis not present

## 2016-07-19 DIAGNOSIS — F0390 Unspecified dementia without behavioral disturbance: Secondary | ICD-10-CM | POA: Diagnosis not present

## 2016-08-05 DIAGNOSIS — Z872 Personal history of diseases of the skin and subcutaneous tissue: Secondary | ICD-10-CM | POA: Diagnosis not present

## 2016-08-05 DIAGNOSIS — Z8673 Personal history of transient ischemic attack (TIA), and cerebral infarction without residual deficits: Secondary | ICD-10-CM | POA: Diagnosis not present

## 2016-08-05 DIAGNOSIS — H5442 Blindness, left eye, normal vision right eye: Secondary | ICD-10-CM | POA: Diagnosis not present

## 2016-08-05 DIAGNOSIS — Z86718 Personal history of other venous thrombosis and embolism: Secondary | ICD-10-CM | POA: Diagnosis not present

## 2016-08-05 DIAGNOSIS — G629 Polyneuropathy, unspecified: Secondary | ICD-10-CM | POA: Diagnosis not present

## 2016-08-05 DIAGNOSIS — F0390 Unspecified dementia without behavioral disturbance: Secondary | ICD-10-CM | POA: Diagnosis not present

## 2016-08-05 DIAGNOSIS — L988 Other specified disorders of the skin and subcutaneous tissue: Secondary | ICD-10-CM | POA: Diagnosis not present

## 2016-08-12 DIAGNOSIS — Z872 Personal history of diseases of the skin and subcutaneous tissue: Secondary | ICD-10-CM | POA: Diagnosis not present

## 2016-08-12 DIAGNOSIS — Z09 Encounter for follow-up examination after completed treatment for conditions other than malignant neoplasm: Secondary | ICD-10-CM | POA: Diagnosis not present

## 2016-08-14 DIAGNOSIS — R600 Localized edema: Secondary | ICD-10-CM | POA: Diagnosis not present

## 2016-08-14 DIAGNOSIS — M79662 Pain in left lower leg: Secondary | ICD-10-CM | POA: Diagnosis not present

## 2016-08-14 DIAGNOSIS — M79661 Pain in right lower leg: Secondary | ICD-10-CM | POA: Diagnosis not present

## 2016-08-14 DIAGNOSIS — M79605 Pain in left leg: Secondary | ICD-10-CM | POA: Diagnosis not present

## 2016-08-14 DIAGNOSIS — M79604 Pain in right leg: Secondary | ICD-10-CM | POA: Diagnosis not present

## 2016-08-18 DIAGNOSIS — Z23 Encounter for immunization: Secondary | ICD-10-CM | POA: Diagnosis not present

## 2016-08-18 DIAGNOSIS — I10 Essential (primary) hypertension: Secondary | ICD-10-CM | POA: Diagnosis not present

## 2016-08-18 DIAGNOSIS — M79605 Pain in left leg: Secondary | ICD-10-CM | POA: Diagnosis not present

## 2016-08-18 DIAGNOSIS — I739 Peripheral vascular disease, unspecified: Secondary | ICD-10-CM | POA: Diagnosis not present

## 2016-09-21 DIAGNOSIS — R51 Headache: Secondary | ICD-10-CM | POA: Diagnosis not present

## 2016-09-21 DIAGNOSIS — G319 Degenerative disease of nervous system, unspecified: Secondary | ICD-10-CM | POA: Diagnosis not present

## 2016-09-21 DIAGNOSIS — M6281 Muscle weakness (generalized): Secondary | ICD-10-CM | POA: Diagnosis not present

## 2016-10-11 DIAGNOSIS — H353131 Nonexudative age-related macular degeneration, bilateral, early dry stage: Secondary | ICD-10-CM | POA: Diagnosis not present

## 2016-10-11 DIAGNOSIS — H401131 Primary open-angle glaucoma, bilateral, mild stage: Secondary | ICD-10-CM | POA: Diagnosis not present

## 2016-11-02 DIAGNOSIS — I63312 Cerebral infarction due to thrombosis of left middle cerebral artery: Secondary | ICD-10-CM | POA: Diagnosis not present

## 2016-11-02 DIAGNOSIS — R413 Other amnesia: Secondary | ICD-10-CM | POA: Diagnosis not present

## 2016-11-02 DIAGNOSIS — G603 Idiopathic progressive neuropathy: Secondary | ICD-10-CM | POA: Diagnosis not present

## 2016-11-02 DIAGNOSIS — Z9181 History of falling: Secondary | ICD-10-CM | POA: Diagnosis not present

## 2016-11-02 DIAGNOSIS — M6289 Other specified disorders of muscle: Secondary | ICD-10-CM | POA: Diagnosis not present

## 2016-12-06 DIAGNOSIS — J189 Pneumonia, unspecified organism: Secondary | ICD-10-CM | POA: Diagnosis not present

## 2016-12-06 DIAGNOSIS — R079 Chest pain, unspecified: Secondary | ICD-10-CM | POA: Diagnosis not present

## 2016-12-06 DIAGNOSIS — F0390 Unspecified dementia without behavioral disturbance: Secondary | ICD-10-CM | POA: Diagnosis not present

## 2016-12-06 DIAGNOSIS — R1033 Periumbilical pain: Secondary | ICD-10-CM | POA: Diagnosis not present

## 2016-12-06 DIAGNOSIS — K7689 Other specified diseases of liver: Secondary | ICD-10-CM | POA: Diagnosis not present

## 2016-12-06 DIAGNOSIS — R1013 Epigastric pain: Secondary | ICD-10-CM | POA: Diagnosis not present

## 2016-12-06 DIAGNOSIS — R0602 Shortness of breath: Secondary | ICD-10-CM | POA: Diagnosis not present

## 2016-12-12 ENCOUNTER — Encounter (HOSPITAL_COMMUNITY): Payer: Self-pay | Admitting: Emergency Medicine

## 2016-12-12 ENCOUNTER — Emergency Department (HOSPITAL_COMMUNITY): Payer: Medicare Other

## 2016-12-12 ENCOUNTER — Inpatient Hospital Stay (HOSPITAL_COMMUNITY)
Admission: EM | Admit: 2016-12-12 | Discharge: 2016-12-15 | DRG: 193 | Disposition: A | Discharge status: Home Health | Payer: Medicare Other | Attending: Internal Medicine | Admitting: Internal Medicine

## 2016-12-12 DIAGNOSIS — I739 Peripheral vascular disease, unspecified: Secondary | ICD-10-CM | POA: Diagnosis present

## 2016-12-12 DIAGNOSIS — R0902 Hypoxemia: Secondary | ICD-10-CM | POA: Diagnosis not present

## 2016-12-12 DIAGNOSIS — I10 Essential (primary) hypertension: Secondary | ICD-10-CM | POA: Diagnosis present

## 2016-12-12 DIAGNOSIS — E860 Dehydration: Secondary | ICD-10-CM | POA: Diagnosis not present

## 2016-12-12 DIAGNOSIS — R05 Cough: Secondary | ICD-10-CM | POA: Diagnosis not present

## 2016-12-12 DIAGNOSIS — G3183 Dementia with Lewy bodies: Secondary | ICD-10-CM | POA: Diagnosis present

## 2016-12-12 DIAGNOSIS — E43 Unspecified severe protein-calorie malnutrition: Secondary | ICD-10-CM | POA: Diagnosis not present

## 2016-12-12 DIAGNOSIS — J189 Pneumonia, unspecified organism: Secondary | ICD-10-CM | POA: Diagnosis present

## 2016-12-12 DIAGNOSIS — R531 Weakness: Secondary | ICD-10-CM | POA: Diagnosis not present

## 2016-12-12 DIAGNOSIS — J1000 Influenza due to other identified influenza virus with unspecified type of pneumonia: Principal | ICD-10-CM | POA: Diagnosis present

## 2016-12-12 DIAGNOSIS — E785 Hyperlipidemia, unspecified: Secondary | ICD-10-CM | POA: Diagnosis present

## 2016-12-12 DIAGNOSIS — H409 Unspecified glaucoma: Secondary | ICD-10-CM | POA: Diagnosis present

## 2016-12-12 DIAGNOSIS — Z6822 Body mass index (BMI) 22.0-22.9, adult: Secondary | ICD-10-CM

## 2016-12-12 DIAGNOSIS — Z7982 Long term (current) use of aspirin: Secondary | ICD-10-CM

## 2016-12-12 DIAGNOSIS — E871 Hypo-osmolality and hyponatremia: Secondary | ICD-10-CM | POA: Diagnosis present

## 2016-12-12 DIAGNOSIS — J101 Influenza due to other identified influenza virus with other respiratory manifestations: Secondary | ICD-10-CM

## 2016-12-12 DIAGNOSIS — J9601 Acute respiratory failure with hypoxia: Secondary | ICD-10-CM | POA: Diagnosis present

## 2016-12-12 DIAGNOSIS — Z7902 Long term (current) use of antithrombotics/antiplatelets: Secondary | ICD-10-CM

## 2016-12-12 DIAGNOSIS — Z9049 Acquired absence of other specified parts of digestive tract: Secondary | ICD-10-CM

## 2016-12-12 DIAGNOSIS — F0280 Dementia in other diseases classified elsewhere without behavioral disturbance: Secondary | ICD-10-CM | POA: Diagnosis present

## 2016-12-12 DIAGNOSIS — R0602 Shortness of breath: Secondary | ICD-10-CM | POA: Diagnosis not present

## 2016-12-12 LAB — BASIC METABOLIC PANEL
Anion gap: 11 (ref 5–15)
BUN: 16 mg/dL (ref 6–20)
CALCIUM: 8.3 mg/dL — AB (ref 8.9–10.3)
CO2: 27 mmol/L (ref 22–32)
CREATININE: 0.89 mg/dL (ref 0.44–1.00)
Chloride: 96 mmol/L — ABNORMAL LOW (ref 101–111)
GFR calc non Af Amer: 54 mL/min — ABNORMAL LOW (ref >60)
Glucose, Bld: 91 mg/dL (ref 65–99)
Potassium: 4.1 mmol/L (ref 3.5–5.1)
SODIUM: 134 mmol/L — AB (ref 135–145)

## 2016-12-12 LAB — URINALYSIS, ROUTINE W REFLEX MICROSCOPIC
Bilirubin Urine: NEGATIVE
Glucose, UA: NEGATIVE mg/dL
Hgb urine dipstick: NEGATIVE
Ketones, ur: 20 mg/dL — AB
Leukocytes, UA: NEGATIVE
Nitrite: NEGATIVE
Protein, ur: 100 mg/dL — AB
Specific Gravity, Urine: 1.026 (ref 1.005–1.030)
pH: 5 (ref 5.0–8.0)

## 2016-12-12 LAB — CBC WITH DIFFERENTIAL/PLATELET
Basophils Absolute: 0 10*3/uL (ref 0.0–0.1)
Basophils Relative: 0 %
Eosinophils Absolute: 0 10*3/uL (ref 0.0–0.7)
Eosinophils Relative: 0 %
HCT: 39.7 % (ref 36.0–46.0)
Hemoglobin: 13 g/dL (ref 12.0–15.0)
Lymphocytes Relative: 6 %
Lymphs Abs: 0.4 10*3/uL — ABNORMAL LOW (ref 0.7–4.0)
MCH: 29.3 pg (ref 26.0–34.0)
MCHC: 32.7 g/dL (ref 30.0–36.0)
MCV: 89.4 fL (ref 78.0–100.0)
Monocytes Absolute: 0.6 10*3/uL (ref 0.1–1.0)
Monocytes Relative: 9 %
Neutro Abs: 5.6 10*3/uL (ref 1.7–7.7)
Neutrophils Relative %: 85 %
Platelets: 284 10*3/uL (ref 150–400)
RBC: 4.44 MIL/uL (ref 3.87–5.11)
RDW: 13.9 % (ref 11.5–15.5)
WBC: 6.6 10*3/uL (ref 4.0–10.5)

## 2016-12-12 LAB — TSH: TSH: 6.387 u[IU]/mL — AB (ref 0.350–4.500)

## 2016-12-12 LAB — EXPECTORATED SPUTUM ASSESSMENT W REFEX TO RESP CULTURE

## 2016-12-12 LAB — PHOSPHORUS: Phosphorus: 3.1 mg/dL (ref 2.5–4.6)

## 2016-12-12 LAB — CBG MONITORING, ED: GLUCOSE-CAPILLARY: 95 mg/dL (ref 65–99)

## 2016-12-12 LAB — EXPECTORATED SPUTUM ASSESSMENT W GRAM STAIN, RFLX TO RESP C

## 2016-12-12 LAB — MAGNESIUM: Magnesium: 1.8 mg/dL (ref 1.7–2.4)

## 2016-12-12 LAB — BRAIN NATRIURETIC PEPTIDE: B Natriuretic Peptide: 16.8 pg/mL (ref 0.0–100.0)

## 2016-12-12 LAB — I-STAT CG4 LACTIC ACID, ED: Lactic Acid, Venous: 1.34 mmol/L (ref 0.5–1.9)

## 2016-12-12 MED ORDER — ENSURE ENLIVE PO LIQD
237.0000 mL | Freq: Two times a day (BID) | ORAL | Status: DC
  Administered 2016-12-13 – 2016-12-14 (×3): 237 mL via ORAL

## 2016-12-12 MED ORDER — ACETAMINOPHEN 650 MG RE SUPP: 650.0000 mg | Freq: Four times a day (QID) | RECTAL | Status: DC | PRN

## 2016-12-12 MED ORDER — ENOXAPARIN SODIUM 40 MG/0.4ML ~~LOC~~ SOLN
40.0000 mg | SUBCUTANEOUS | Status: DC
  Administered 2016-12-13 – 2016-12-14 (×2): 40 mg via SUBCUTANEOUS
  Filled 2016-12-12 (×2): qty 0.4

## 2016-12-12 MED ORDER — ONDANSETRON HCL 4 MG/2ML IJ SOLN: 4.0000 mg | Freq: Four times a day (QID) | INTRAMUSCULAR | Status: DC | PRN

## 2016-12-12 MED ORDER — DEXTROSE 5 % IV SOLN
1.0000 g | Freq: Once | INTRAVENOUS | Status: AC
  Administered 2016-12-12: 1 g via INTRAVENOUS
  Filled 2016-12-12: qty 1

## 2016-12-12 MED ORDER — DONEPEZIL HCL 23 MG PO TABS
23.0000 mg | ORAL_TABLET | Freq: Every day | ORAL | Status: DC
  Administered 2016-12-12 – 2016-12-14 (×3): 23 mg via ORAL
  Filled 2016-12-12 (×3): qty 1

## 2016-12-12 MED ORDER — DEXTROSE 5 % IV SOLN
1.0000 g | INTRAVENOUS | Status: DC
  Administered 2016-12-13 – 2016-12-14 (×2): 1 g via INTRAVENOUS
  Filled 2016-12-12 (×2): qty 1

## 2016-12-12 MED ORDER — CLOPIDOGREL BISULFATE 75 MG PO TABS
75.0000 mg | ORAL_TABLET | Freq: Every day | ORAL | Status: DC
  Administered 2016-12-12 – 2016-12-15 (×4): 75 mg via ORAL
  Filled 2016-12-12 (×4): qty 1

## 2016-12-12 MED ORDER — LATANOPROST 0.005 % OP SOLN
1.0000 [drp] | Freq: Every day | OPHTHALMIC | Status: DC
  Administered 2016-12-12 – 2016-12-14 (×3): 1 [drp] via OPHTHALMIC
  Filled 2016-12-12: qty 2.5

## 2016-12-12 MED ORDER — ONDANSETRON HCL 4 MG PO TABS: 4.0000 mg | ORAL_TABLET | Freq: Four times a day (QID) | ORAL | Status: DC | PRN

## 2016-12-12 MED ORDER — IPRATROPIUM-ALBUTEROL 0.5-2.5 (3) MG/3ML IN SOLN: 3.0000 mL | Freq: Four times a day (QID) | RESPIRATORY_TRACT | Status: DC | PRN

## 2016-12-12 MED ORDER — VANCOMYCIN HCL IN DEXTROSE 1-5 GM/200ML-% IV SOLN
1000.0000 mg | INTRAVENOUS | Status: DC
  Administered 2016-12-12 – 2016-12-14 (×3): 1000 mg via INTRAVENOUS
  Filled 2016-12-12 (×2): qty 200

## 2016-12-12 MED ORDER — SODIUM CHLORIDE 0.9 % IV SOLN
INTRAVENOUS | Status: AC
  Administered 2016-12-12: 20:00:00 via INTRAVENOUS

## 2016-12-12 MED ORDER — HYDROCODONE-ACETAMINOPHEN 5-325 MG PO TABS: 1.0000 | ORAL_TABLET | Freq: Three times a day (TID) | ORAL | Status: DC | PRN

## 2016-12-12 MED ORDER — ASPIRIN EC 81 MG PO TBEC
81.0000 mg | DELAYED_RELEASE_TABLET | Freq: Every day | ORAL | Status: DC
  Administered 2016-12-12 – 2016-12-15 (×4): 81 mg via ORAL
  Filled 2016-12-12 (×4): qty 1

## 2016-12-12 MED ORDER — GUAIFENESIN ER 600 MG PO TB12
600.0000 mg | ORAL_TABLET | Freq: Two times a day (BID) | ORAL | Status: DC
  Administered 2016-12-12 – 2016-12-15 (×5): 600 mg via ORAL
  Filled 2016-12-12 (×6): qty 1

## 2016-12-12 MED ORDER — MEMANTINE HCL 10 MG PO TABS
10.0000 mg | ORAL_TABLET | Freq: Every day | ORAL | Status: DC
  Administered 2016-12-12 – 2016-12-15 (×4): 10 mg via ORAL
  Filled 2016-12-12 (×4): qty 1

## 2016-12-12 MED ORDER — ACETAMINOPHEN 325 MG PO TABS: 650.0000 mg | ORAL_TABLET | Freq: Four times a day (QID) | ORAL | Status: DC | PRN

## 2016-12-12 NOTE — Progress Notes (Signed)
Pharmacy Antibiotic Note  Deanna Meyers is a 83 y.o. female admitted on 12/12/2016 with pneumonia. Diagnosed with pneumonia on 1/8 and prescribed Levaquin but is not improving. Cefepime is ordered in the ED and pharmacy has been consulted for vancomcyin dosing. SCr is wnl but due to advanced age, her CrCl ~3834ml/min.  Plan: Vancomycin 1g IV every 24 hours.  Goal trough 15-20 mcg/mL.  Measure Vanc trough at steady state. Follow up renal fxn, culture results, and clinical course.   Height: 5\' 4"  (162.6 cm) Weight: 135 lb (61.2 kg) IBW/kg (Calculated) : 54.7  Temp (24hrs), Avg:99.1 F (37.3 C), Min:98.6 F (37 C), Max:99.6 F (37.6 C)   Recent Labs Lab 12/12/16 1348 12/12/16 1408  WBC 6.6  --   CREATININE 0.89  --   LATICACIDVEN  --  1.34    Estimated Creatinine Clearance: 34.1 mL/min (by C-G formula based on SCr of 0.89 mg/dL).    No Known Allergies  Antimicrobials this admission: Cefepime x 1   Vanc 1/14 >>   Dose adjustments this admission:   Microbiology results: BCx: UCx:  Sputum: MRSA PCR:  Thank you for allowing pharmacy to be a part of this patient's care.  Charolotte Ekeom Juliani Laduke, PharmD, pager [phone]. 12/12/2016,2:51 PM.

## 2016-12-12 NOTE — ED Notes (Signed)
CINDY RN TOOK REPORT.

## 2016-12-12 NOTE — ED Notes (Signed)
ADMITTING MD MADERA Provider at bedside. 

## 2016-12-12 NOTE — H&P (Signed)
History and Physical    Deanna Meyers ZOX:[id number] DOB: November 04, 1921 DOA: 12/12/2016  Referring Provider: Dr. Fayrene Fearing PCP: Hadley Pen, MD   Patient coming from: home  Chief Complaint: worsening SOB, anorexia, general malaise ad near syncope.  HPI: Deanna Meyers is a 83 y.o. female with PMH significant for HTN, HLD, mild dementia, PVD and glaucoma; who presented to ED with complaints of general malaise and worsening SOB. Family also endorses anorexia and decrease fluid intake. Patient reported been diagnosed with [facility]NA [facility] on 12/06/16 and treated ambulatorily with Levaquin. Per family patient symptoms has progressed and now associated with productive cough and worsening work of breathing. Patient endorses chills and nausea.  Patient denies CP, fever, abd pain, vomiting, HA's blurred vision, dysuria, hematuria, hematochezia and melena.  ED Course: patient has repeat CXR demonstrating vascular congestion and right upper lobe superimposed opacities suggesting PNA. Patient to be hypoxic in mid 80's on RA and dehydrated on physical exam with hyponatremia. Patient cultures taken, started on IV antibiotics and oxygen supplementation. TRH called to place in observation and provide further evaluation and treatment.   Review of Systems:  All other systems reviewed and apart from HPI, are negative.  Past Medical History:  Diagnosis Date  . Discoloration of skin of toe 08/30/2011   LA doppler - bilateral ABI's - no evidence of arterial insufficiency; bilateral PVRs - normal waveforms; bilateral toe pressures - mod/severe perfusion for healing  . Hyperlipidemia   . Hypertension   . PVD (peripheral vascular disease) (HCC)    decreased blood flow to toes    Past Surgical History:  Procedure Laterality Date  . CHOLECYSTECTOMY  11/12/2013     reports that she has never smoked. She does not have any smokeless tobacco history on file. She reports that she does not drink  alcohol or use drugs.  No Known Allergies  History reviewed: no pertinent contribution for her age any longer; but essentially reports just HTN.   Prior to Admission medications   Medication Sig Start Date End Date Taking? Authorizing Provider  aspirin EC 81 MG tablet Take 81 mg by mouth daily.   Yes Historical Provider, MD  clopidogrel (PLAVIX) 75 MG tablet Take 75 mg by mouth daily.   Yes Historical Provider, MD  donepezil (ARICEPT) 23 MG TABS tablet Take 23 mg by mouth at bedtime.   Yes Historical Provider, MD  hydrochlorothiazide (HYDRODIURIL) 12.5 MG tablet Take 12.5 mg by mouth daily.    Yes Historical Provider, MD  HYDROcodone-acetaminophen (NORCO/VICODIN) 5-325 MG tablet Take 1 tablet by mouth every 6 (six) hours as needed for pain. for pain 12/07/16  Yes Historical Provider, MD  latanoprost (XALATAN) 0.005 % ophthalmic solution Place 1 drop into the left eye at bedtime.  12/13/14  Yes Historical Provider, MD  levofloxacin (LEVAQUIN) 750 MG tablet Take 750 mg by mouth daily. Started 1/9 x 10 days 12/07/16  Yes Historical Provider, MD  memantine (NAMENDA) 5 MG tablet Take 10 mg by mouth daily. 11/02/16  Yes Historical Provider, MD  Multiple Vitamins-Minerals (EYE VITAMINS PO) Take 1 tablet by mouth daily.    Yes Historical Provider, MD    Physical Exam: Vitals:   12/12/16 1600 12/12/16 1700 12/12/16 1713 12/12/16 1730  BP: 146/82 114/63 128/63 130/68  Pulse: 72 (!) 57 65 67  Resp: 15 20 20 20   Temp:   99 F (37.2 C) 98 F (36.7 C)  TempSrc:   Oral Oral  SpO2: 95% 94% 98% 100%  Weight:    60.6 kg (133 lb 9.6 oz)  Height:        Constitutional: mild resp distress and reports very dry mouth. No fever currently, denies vomiting. Complaints of nausea. Requiring 2L [place] O2 supplementation. Eyes: PERTLA, lids and conjunctivae normal, no icterus ENMT: Mucous membranes and tongue very dry and with thick saliva. Posterior pharynx clear of any exudate or lesions. No Thrush Neck: normal,  supple, no masses, no thyromegaly, no JVD Respiratory: Fair air movement, positive exp wheezing and increase rhonchi R > L. Patient with tachypnea, but with normal effort.  Cardiovascular: S1 & S2 heard, regular rate and rhythm, no murmurs / rubs / gallops. Trace Le edema bilateral. Abdomen: No distension, no tenderness, no masses palpated. No hepatosplenomegaly. Bowel sounds normal.  Musculoskeletal: no clubbing / cyanosis. No joint deformity upper and lower extremities. Good ROM, no contractures. Normal muscle tone.  Neurologic: CN 2-12 grossly intact. Sensation intact, DTR normal. Strength 3-4/5 in all 4 limbs due poor effort and deconditioning. No focal deficit seen  Psychiatric: Normal judgment and insight. Alert and oriented x 2. Normal mood.    Labs on Admission: I have personally reviewed following labs and imaging studies  CBC:  Recent Labs Lab 12/12/16 1348  WBC 6.6  NEUTROABS 5.6  HGB 13.0  HCT 39.7  MCV 89.4  PLT 284   Basic Metabolic Panel:  Recent Labs Lab 12/12/16 1348  NA 134*  K 4.1  CL 96*  CO2 27  GLUCOSE 91  BUN 16  CREATININE 0.89  CALCIUM 8.3*   GFR: Estimated Creatinine Clearance: 34.1 mL/min (by C-G formula based on SCr of 0.89 mg/dL).   CBG:  Recent Labs Lab 12/12/16 1123  GLUCAP 95   Urine analysis:    Component Value Date/Time   COLORURINE AMBER (A) 12/12/2016 1348   APPEARANCEUR HAZY (A) 12/12/2016 1348   LABSPEC 1.026 12/12/2016 1348   PHURINE 5.0 12/12/2016 1348   GLUCOSEU NEGATIVE 12/12/2016 1348   HGBUR NEGATIVE 12/12/2016 1348   BILIRUBINUR NEGATIVE 12/12/2016 1348   KETONESUR 20 (A) 12/12/2016 1348   PROTEINUR 100 (A) 12/12/2016 1348   NITRITE NEGATIVE 12/12/2016 1348   LEUKOCYTESUR NEGATIVE 12/12/2016 1348    Radiological Exams on Admission: Dg Chest 2 View  Result Date: 12/12/2016 CLINICAL DATA:  Pt c/o fever, syncopal episode today, cough. Pt diagnosed with pneumonia on Monday. EXAM: CHEST  2 VIEW COMPARISON:   12/06/2016 FINDINGS: Right upper lobe airspace opacity. Kerley B-lines are present compatible with at least interstitial pulmonary edema. Some of the right upper lobe opacity is chronic. The moderate enlargement of the cardiopericardial silhouette. Tortuosity of the thoracic aorta. Biapical pleuroparenchymal scarring. Thoracolumbar scoliosis. Lumbar compression fracture likely at L4. This has been present on prior exams including 11/04/2015. Chronic T7 compression fracture. Compression fracture T6 is stable from 12/06/2016 but new from 11/04/2015. IMPRESSION: 1. Moderate enlargement of the cardiopericardial silhouette with interstitial edema and mild congestive heart failure. 2. Right upper lobe airspace opacity may reflect superimposed pneumonia, although some of this is chronic. There is also biapical pleuroparenchymal scarring. 3. Chronic compression fractures at T7 and L4. A compression fracture T6 is stable from 12/06/2016 but new from 11/04/2015. Electronically Signed   By: Gaylyn RongWalter  Liebkemann M.D.   On: 12/12/2016 12:16    EKG:  Sinus rhythm, no acute ischemic changes appreciated.  Assessment/Plan 1-Community acquired pneumonia: with acute resp failure and hypoxia. Patient failed treatment with levaquin. -will bring to the hospital, broader her antibiotics more -check cultures  and start patient on flutter valve -will use PRN nebulizer and pulmicort. -will provide IVF's, mucinex, supportive care and weaneed O2 as tolerated -will follow clinical response   2-Essential hypertension -overall stable and slightly soft on admission -will hold HCTS (her chronic HCTZ) -will provide IVF's and ordered heart healthy diet   3-HLD (hyperlipidemia) -will continue statins  4-Dehydration and Generalized weakness -appears to be associated with POOR PO intake and current infection leading to deconditioning  -will provide gentle IVF's, consult nutritional service and ask PT/OT to evaluate on her -ensure  live BID started  5-Dementia without behavioral disturbance -will continue supportive care and continue aricept/namenda  6-glaucoma: -will continue xalatan  7-hx of PVD -stable overall -will continue ASA, plavix and statins    Time: 55 minutes   DVT prophylaxis: lovenox Code Status: Full Family Communication: daughters at bedside Disposition Plan: to be determine; but hopefully back home in less than 2 midnights. Needs to have breathing and O2 sat issues stabilized. Consults called: none  Admission status: observation, LOS < 2 midnights, Medsurg bed   Vassie Loll MD [facility] Pager [phone]  If 7PM-7AM, please contact night-coverage [email or web address] Password [email or web address]  12/12/2016, 7:07 PM

## 2016-12-12 NOTE — ED Triage Notes (Signed)
Pt c/o fever, syncopal episode today, cough. Pt diagnosed with pneumonia on Monday and sent home due to hospital overcrowding. Pt appeared to be better yesterday but has not recovered.

## 2016-12-12 NOTE — ED Notes (Signed)
Chest xray complete

## 2016-12-13 ENCOUNTER — Encounter (HOSPITAL_COMMUNITY): Payer: Self-pay

## 2016-12-13 DIAGNOSIS — J189 Pneumonia, unspecified organism: Secondary | ICD-10-CM | POA: Diagnosis not present

## 2016-12-13 DIAGNOSIS — E871 Hypo-osmolality and hyponatremia: Secondary | ICD-10-CM | POA: Diagnosis present

## 2016-12-13 DIAGNOSIS — I739 Peripheral vascular disease, unspecified: Secondary | ICD-10-CM | POA: Diagnosis present

## 2016-12-13 DIAGNOSIS — F0280 Dementia in other diseases classified elsewhere without behavioral disturbance: Secondary | ICD-10-CM | POA: Diagnosis present

## 2016-12-13 DIAGNOSIS — R0602 Shortness of breath: Secondary | ICD-10-CM | POA: Diagnosis not present

## 2016-12-13 DIAGNOSIS — J9601 Acute respiratory failure with hypoxia: Secondary | ICD-10-CM

## 2016-12-13 DIAGNOSIS — Z7902 Long term (current) use of antithrombotics/antiplatelets: Secondary | ICD-10-CM | POA: Diagnosis not present

## 2016-12-13 DIAGNOSIS — E785 Hyperlipidemia, unspecified: Secondary | ICD-10-CM | POA: Diagnosis present

## 2016-12-13 DIAGNOSIS — H409 Unspecified glaucoma: Secondary | ICD-10-CM | POA: Diagnosis present

## 2016-12-13 DIAGNOSIS — J1000 Influenza due to other identified influenza virus with unspecified type of pneumonia: Secondary | ICD-10-CM | POA: Diagnosis present

## 2016-12-13 DIAGNOSIS — I10 Essential (primary) hypertension: Secondary | ICD-10-CM | POA: Diagnosis not present

## 2016-12-13 DIAGNOSIS — E43 Unspecified severe protein-calorie malnutrition: Secondary | ICD-10-CM | POA: Diagnosis present

## 2016-12-13 DIAGNOSIS — Z9049 Acquired absence of other specified parts of digestive tract: Secondary | ICD-10-CM | POA: Diagnosis not present

## 2016-12-13 DIAGNOSIS — Z7982 Long term (current) use of aspirin: Secondary | ICD-10-CM | POA: Diagnosis not present

## 2016-12-13 DIAGNOSIS — G3183 Dementia with Lewy bodies: Secondary | ICD-10-CM | POA: Diagnosis present

## 2016-12-13 DIAGNOSIS — E860 Dehydration: Secondary | ICD-10-CM | POA: Diagnosis not present

## 2016-12-13 DIAGNOSIS — Z6822 Body mass index (BMI) 22.0-22.9, adult: Secondary | ICD-10-CM | POA: Diagnosis not present

## 2016-12-13 DIAGNOSIS — R531 Weakness: Secondary | ICD-10-CM | POA: Diagnosis not present

## 2016-12-13 LAB — CBC
HCT: 34.7 % — ABNORMAL LOW (ref 36.0–46.0)
Hemoglobin: 11.4 g/dL — ABNORMAL LOW (ref 12.0–15.0)
MCH: 29.2 pg (ref 26.0–34.0)
MCHC: 32.9 g/dL (ref 30.0–36.0)
MCV: 89 fL (ref 78.0–100.0)
Platelets: 249 10*3/uL (ref 150–400)
RBC: 3.9 MIL/uL (ref 3.87–5.11)
RDW: 14 % (ref 11.5–15.5)
WBC: 5 10*3/uL (ref 4.0–10.5)

## 2016-12-13 LAB — BASIC METABOLIC PANEL
ANION GAP: 9 (ref 5–15)
BUN: 15 mg/dL (ref 6–20)
CALCIUM: 7.6 mg/dL — AB (ref 8.9–10.3)
CO2: 26 mmol/L (ref 22–32)
Chloride: 98 mmol/L — ABNORMAL LOW (ref 101–111)
Creatinine, Ser: 0.73 mg/dL (ref 0.44–1.00)
GLUCOSE: 94 mg/dL (ref 65–99)
Potassium: 3.5 mmol/L (ref 3.5–5.1)
Sodium: 133 mmol/L — ABNORMAL LOW (ref 135–145)

## 2016-12-13 LAB — STREP PNEUMONIAE URINARY ANTIGEN: STREP PNEUMO URINARY ANTIGEN: NEGATIVE

## 2016-12-13 MED ORDER — PREDNISONE 20 MG PO TABS
40.0000 mg | ORAL_TABLET | Freq: Every day | ORAL | Status: DC
  Administered 2016-12-14 – 2016-12-15 (×2): 40 mg via ORAL
  Filled 2016-12-13 (×2): qty 2

## 2016-12-13 MED ORDER — ORAL CARE MOUTH RINSE
15.0000 mL | Freq: Two times a day (BID) | OROMUCOSAL | Status: DC
  Administered 2016-12-14 (×2): 15 mL via OROMUCOSAL

## 2016-12-13 MED ORDER — BENZONATATE 100 MG PO CAPS
100.0000 mg | ORAL_CAPSULE | Freq: Three times a day (TID) | ORAL | Status: DC | PRN
  Administered 2016-12-13 – 2016-12-14 (×3): 100 mg via ORAL
  Filled 2016-12-13 (×3): qty 1

## 2016-12-13 NOTE — Progress Notes (Signed)
Initial Nutrition Assessment  DOCUMENTATION CODES:   Severe malnutrition in context of chronic illness  INTERVENTION:   Continue Ensure Enlive po BID, each supplement provides 350 kcal and 20 grams of protein Encourage PO intake RD to continue to monitor  NUTRITION DIAGNOSIS:   Malnutrition related to chronic illness as evidenced by energy intake < 75% for > or equal to 1 month, moderate depletion of body fat, severe depletion of muscle mass.  GOAL:   Patient will meet greater than or equal to 90% of their needs  MONITOR:   PO intake, Supplement acceptance, Labs, Weight trends, I & O's  REASON FOR ASSESSMENT:   Consult Assessment of nutrition requirement/status  ASSESSMENT:   83 y.o. female with PMH significant for HTN, HLD, mild dementia, PVD and glaucoma; who presented to ED with complaints of general malaise and worsening SOB. Family also endorses anorexia and decrease fluid intake. Patient [facility] and treated ambulatorily with Levaquin. Per family patient symptoms has progressed and now associated with productive cough and worsening work of breathing. Patient endorses chills and nausea.   Patient in room with daughter at bedside. Pt reports never being a "big eater". She usually consumes 2 small meals daily. Breakfast would consist of cereal and then she would have a dinner meal. This morning she states she ate an egg and a piece of toast. Pt denies issues with chewing or swallowing. Pt's daughter states pt has become progressively weaker. Pt is willing to try Ensure supplements.  Pt's weight is down since last recording from 2016. Nutrition-Focused physical exam completed. Findings are moderate fat depletion, severe muscle depletion, and no edema.   Medications reviewed. Labs reviewed: Low Na Mg/Phos WNL  Diet Order:  DIET SOFT Room service appropriate? Yes; Fluid consistency: Thin  Skin:  Reviewed, no  issues  Last BM:  PTA  Height:   Ht Readings from Last 1 Encounters:  12/12/16 5\' 4"  (1.626 m)    Weight:   Wt Readings from Last 1 Encounters:  12/12/16 133 lb 9.6 oz (60.6 kg)    Ideal Body Weight:  54.5 kg  BMI:  Body mass index is 22.93 kg/m.  Estimated Nutritional Needs:   Kcal:  1300-1500  Protein:  65-75g  Fluid:  1.5L/day  EDUCATION NEEDS:   Education needs addressed  Tilda FrancoLindsey Omya Winfield, MS, RD, LDN Pager: [phone] After Hours Pager: [phone]

## 2016-12-13 NOTE — Progress Notes (Signed)
PROGRESS NOTE    Deanna Meyers  ZOX:[id number] DOB: 11-Sep-1921 DOA: 12/12/2016 PCP: Hadley Pen, MD   Brief Narrative: Deanna Meyers is a 83 y.o. female with a history of hypertension, hyperlipidemia, dementia, peripheral vascular disease, glaucoma. She presented with generalized malaise and worsening dyspnea and found to have pneumonia that has been unresponsive to outpatient management addition to hypoxic respiratory failure.   Assessment & Plan:   Principal Problem:   Community acquired pneumonia Active Problems:   Essential hypertension   HLD (hyperlipidemia)   Dehydration   Generalized weakness   Lewy body dementia without behavioral disturbance  Community acquired pneumonia Bronchitis Failed outpatient management. Patient with significant wheezing on exam. Has a history of chronic lung disease of unknown origin possibly secondary to recurrent pneumonias -Continue vancomycin and cefepime -Start prednisone -Albuterol every 6 hours when necessary for wheezing or dyspnea -PT evaluation -Sputum culture pending -Blood culture pending  Acute risk for failure with hypoxia Secondary to pneumonia. -Wean oxygen to room air as tolerated  Essential hypertension Hydrochlorothiazide held on admission secondary to soft blood pressures. Current on lower side -continue to hold hydrochlorothiazide  Hyperlipidemia  Lewy body dementia -continue donepezil and memantine    DVT prophylaxis: Lovenox Code Status: Full code Family Communication: Daughters at bedside Disposition Plan: Anticipate discharge home in 1-2 days   Consultants:   None  Procedures:  None  Antimicrobials:  Vancomycin (1/14>>  Cefepime (1/14>>    Subjective: Patient reports feeling better today from yesterday. Wheezing. No chest pain.  Objective: Vitals:   12/12/16 1713 12/12/16 1730 12/12/16 2103 12/13/16 0553  BP: 128/63 130/68 (!) 105/42 (!) 113/46  Pulse: 65 67 61 67    Resp: 20 20 16 14   Temp: 99 F (37.2 C) 98 F (36.7 C) 97.5 F (36.4 C) 98.9 F (37.2 C)  TempSrc: Oral Oral Oral Oral  SpO2: 98% 100% 99% 93%  Weight:  60.6 kg (133 lb 9.6 oz)    Height:        Intake/Output Summary (Last 24 hours) at 12/13/16 1209 Last data filed at 12/13/16 0554  Gross per 24 hour  Intake              440 ml  Output              150 ml  Net              290 ml   Filed Weights   12/12/16 1339 12/12/16 1730  Weight: 61.2 kg (135 lb) 60.6 kg (133 lb 9.6 oz)    Examination:  General exam: Appears calm and comfortable Respiratory system: Diffuse wheezing. Respiratory effort normal. Cardiovascular system: S1 & S2 heard, RRR. No murmurs, rubs, gallops or clicks. Gastrointestinal system: Abdomen is nondistended, soft and nontender. Normal bowel sounds heard. Central nervous system: Alert and oriented. No focal neurological deficits. Extremities: No edema. No calf tenderness Skin: No cyanosis. No rashes Psychiatry: Judgement and insight appear normal. Mood & affect appropriate.     Data Reviewed: I have personally reviewed following labs and imaging studies  CBC:  Recent Labs Lab 12/12/16 1348 12/13/16 0437  WBC 6.6 5.0  NEUTROABS 5.6  --   HGB 13.0 11.4*  HCT 39.7 34.7*  MCV 89.4 89.0  PLT 284 249   Basic Metabolic Panel:  Recent Labs Lab 12/12/16 1348 12/12/16 1918 12/13/16 0437  NA 134*  --  133*  K 4.1  --  3.5  CL 96*  --  98*  CO2 27  --  26  GLUCOSE 91  --  94  BUN 16  --  15  CREATININE 0.89  --  0.73  CALCIUM 8.3*  --  7.6*  MG  --  1.8  --   PHOS  --  3.1  --    GFR: Estimated Creatinine Clearance: 37.9 mL/min (by C-G formula based on SCr of 0.73 mg/dL). Liver Function Tests: No results for input(s): AST, ALT, ALKPHOS, BILITOT, PROT, ALBUMIN in the last 168 hours. No results for input(s): LIPASE, AMYLASE in the last 168 hours. No results for input(s): AMMONIA in the last 168 hours. Coagulation Profile: No results for  input(s): INR, PROTIME in the last 168 hours. Cardiac Enzymes: No results for input(s): CKTOTAL, CKMB, CKMBINDEX, TROPONINI in the last 168 hours. BNP (last 3 results) No results for input(s): PROBNP in the last 8760 hours. HbA1C: No results for input(s): HGBA1C in the last 72 hours. CBG:  Recent Labs Lab 12/12/16 1123  GLUCAP 95   Lipid Profile: No results for input(s): CHOL, HDL, LDLCALC, TRIG, CHOLHDL, LDLDIRECT in the last 72 hours. Thyroid Function Tests:  Recent Labs  12/12/16 1918  TSH 6.387*   Anemia Panel: No results for input(s): VITAMINB12, FOLATE, FERRITIN, TIBC, IRON, RETICCTPCT in the last 72 hours. Sepsis Labs:  Recent Labs Lab 12/12/16 1408  LATICACIDVEN 1.34    Recent Results (from the past 240 hour(s))  Culture, sputum-assessment     Status: None   Collection Time: 12/12/16  8:55 PM  Result Value Ref Range Status   Specimen Description SPUTUM  Final   Special Requests NONE  Final   Sputum evaluation   Final    Sputum specimen not acceptable for testing.  Please recollect.   CALLED TO Kerin Perna [id number] @ 2228 BY J SCOTTON    Report Status 12/12/2016 FINAL  Final         Radiology Studies: Dg Chest 2 View  Result Date: 12/12/2016 CLINICAL DATA:  Pt c/o fever, syncopal episode today, cough. Pt diagnosed with pneumonia on Monday. EXAM: CHEST  2 VIEW COMPARISON:  12/06/2016 FINDINGS: Right upper lobe airspace opacity. Kerley B-lines are present compatible with at least interstitial pulmonary edema. Some of the right upper lobe opacity is chronic. The moderate enlargement of the cardiopericardial silhouette. Tortuosity of the thoracic aorta. Biapical pleuroparenchymal scarring. Thoracolumbar scoliosis. Lumbar compression fracture likely at L4. This has been present on prior exams including 11/04/2015. Chronic T7 compression fracture. Compression fracture T6 is stable from 12/06/2016 but new from 11/04/2015. IMPRESSION: 1. Moderate enlargement of the  cardiopericardial silhouette with interstitial edema and mild congestive heart failure. 2. Right upper lobe airspace opacity may reflect superimposed pneumonia, although some of this is chronic. There is also biapical pleuroparenchymal scarring. 3. Chronic compression fractures at T7 and L4. A compression fracture T6 is stable from 12/06/2016 but new from 11/04/2015. Electronically Signed   By: Gaylyn Rong M.D.   On: 12/12/2016 12:16        Scheduled Meds: . aspirin EC  81 mg Oral Daily  . ceFEPime (MAXIPIME) IV  1 g Intravenous Q24H  . clopidogrel  75 mg Oral Daily  . donepezil  23 mg Oral QHS  . enoxaparin (LOVENOX) injection  40 mg Subcutaneous Q24H  . feeding supplement (ENSURE ENLIVE)  237 mL Oral BID BM  . guaiFENesin  600 mg Oral BID  . latanoprost  1 drop Left Eye QHS  . memantine  10 mg Oral Daily  .  vancomycin  1,000 mg Intravenous Q24H   Continuous Infusions:   LOS: 0 days     Jacquelin HawkingRalph Nettey [facility] 12/13/2016, 12:09 PM Pager: [phone]  If 7PM-7AM, please contact night-coverage [email or web address] Password [email or web address] 12/13/2016, 12:09 PM

## 2016-12-13 NOTE — Evaluation (Signed)
Physical Therapy Evaluation Patient Details Name: Deanna Meyers MRN: [id number] DOB: [birth year] Today's Date: 12/13/2016   History of Present Illness  83 y.o. female with PMH significant for HTN, HLD, mild dementia, PVD and glaucoma; who presented to ED with complaints of general malaise and worsening SOB. Family also endorses anorexia and decrease fluid intake. Dx of PNA.   Clinical Impression  Pt admitted with above diagnosis. Pt currently with functional limitations due to the deficits listed below (see PT Problem List). Pt ambulated 120' with RW, SaO2 84% on RA, 94% on 2L O2. Pt will benefit from skilled PT to increase their independence and safety with mobility to allow discharge to the venue listed below.       Follow Up Recommendations Home health PT    Equipment Recommendations  Other (comment) (oxygen)    Recommendations for Other Services       Precautions / Restrictions Precautions Precautions: Fall Precaution Comments: monitor O2 Restrictions Weight Bearing Restrictions: No      Mobility  Bed Mobility               General bed mobility comments: NT -up in recliner  Transfers Overall transfer level: Needs assistance Equipment used: Rolling walker (2 wheeled) Transfers: Sit to/from Stand Sit to Stand: Min assist         General transfer comment: VCs hand placement  Ambulation/Gait Ambulation/Gait assistance: Min guard Ambulation Distance (Feet): 120 Feet Assistive device: Rolling walker (2 wheeled) Gait Pattern/deviations: Step-through pattern;Decreased stride length   Gait velocity interpretation: at or above normal speed for age/gender General Gait Details: VCs to lift head, SaO2 84% on RA walking, 94% on 2L O2, VCs pursed lip breathing, 1/4 dyspnea  Stairs            Wheelchair Mobility    Modified Rankin (Stroke Patients Only)       Balance Overall balance assessment: Needs assistance   Sitting balance-Leahy Scale: Good        Standing balance-Leahy Scale: Fair                               Pertinent Vitals/Pain Pain Assessment: No/denies pain    Home Living Family/patient expects to be discharged to:: Private residence Living Arrangements: Alone Available Help at Discharge: Family;Available 24 hours/day (3 daughters can assist)   Home Access: Level entry     Home Layout: One level Home Equipment: Walker - 4 wheels;Shower seat      Prior Function Level of Independence: Independent with assistive device(s)               Hand Dominance        Extremity/Trunk Assessment   Upper Extremity Assessment Upper Extremity Assessment: Overall WFL for tasks assessed    Lower Extremity Assessment Lower Extremity Assessment: Overall WFL for tasks assessed    Cervical / Trunk Assessment Cervical / Trunk Assessment: Normal  Communication   Communication: No difficulties  Cognition Arousal/Alertness: Awake/alert   Overall Cognitive Status: Within Functional Limits for tasks assessed (some short term memory issues, did not recall near syncopal event PTA, family stated she did have near syncope PTA)                      General Comments      Exercises     Assessment/Plan    PT Assessment Patient needs continued PT services  PT Problem List Decreased mobility;Decreased activity  tolerance;Cardiopulmonary status limiting activity;Decreased balance          PT Treatment Interventions Gait training;Functional mobility training;Balance training;Therapeutic exercise;Therapeutic activities;Patient/family education    PT Goals (Current goals can be found in the Care Plan section)  Acute Rehab PT Goals Patient Stated Goal: likes to watch tv and go for walks PT Goal Formulation: With patient/family Time For Goal Achievement: 12/27/16 Potential to Achieve Goals: Good    Frequency Min 3X/week   Barriers to discharge        Co-evaluation               End  of Session Equipment Utilized During Treatment: Gait belt;Oxygen Activity Tolerance: Patient tolerated treatment well Patient left: in bed;with call bell/phone within reach;with family/visitor present Nurse Communication: Mobility status         Time: [id number] PT Time Calculation (min) (ACUTE ONLY): 27 min   Charges:   PT Evaluation $PT Eval Low Complexity: 1 Procedure PT Treatments $Gait Training: 8-22 mins   PT G Codes:        Tamala SerUhlenberg, Blanca Thornton Kistler 12/13/2016, 12:56 PM [phone]

## 2016-12-13 NOTE — ED Provider Notes (Signed)
[facility]-EMERGENCY DEPT Provider Note   CSN: [id number] Arrival date & time: 12/12/16  1103     History   Chief Complaint Chief Complaint  Patient presents with  . Loss of Consciousness    HPI Deanna Meyers is a 83 y.o. female.  HPI Patient presents to the emergency department with worsening weakness, cough and shortness of breath.  The patient was diagnosed with pneumonia on Sunday at [facility] and placed on Levaquin.  She has taken a full 6 days worth of the medication without significant relief of her symptoms.  Family states she got more lethargic and was near syncopal today.  The patient states that she is just feeling weak.The patient denies chest pain, shortness of breath, headache,blurred vision, neck pain,  weakness, numbness, dizziness, anorexia, edema, abdominal pain, nausea, vomiting, diarrhea, rash, back pain, dysuria, hematemesis, bloody stool, near syncope, or syncope. Past Medical History:  Diagnosis Date  . Discoloration of skin of toe 08/30/2011   LA doppler - bilateral ABI's - no evidence of arterial insufficiency; bilateral PVRs - normal waveforms; bilateral toe pressures - mod/severe perfusion for healing  . Hyperlipidemia   . Hypertension   . PVD (peripheral vascular disease) (HCC)    decreased blood flow to toes    Patient Active Problem List   Diagnosis Date Noted  . Acute respiratory failure with hypoxia (HCC) 12/13/2016  . CAP (community acquired pneumonia) 12/13/2016  . Community acquired pneumonia 12/12/2016  . HLD (hyperlipidemia) 12/12/2016  . Dehydration 12/12/2016  . Generalized weakness 12/12/2016  . Lewy body dementia without behavioral disturbance   . Essential hypertension 12/17/2014  . Dyspnea 06/17/2013  . Dyspnea 06/17/2013    Past Surgical History:  Procedure Laterality Date  . CHOLECYSTECTOMY  11/12/2013    OB History    No data available       Home Medications    Prior to Admission medications   Medication Sig  Start Date End Date Taking? Authorizing Provider  aspirin EC 81 MG tablet Take 81 mg by mouth daily.   Yes Historical Provider, MD  clopidogrel (PLAVIX) 75 MG tablet Take 75 mg by mouth daily.   Yes Historical Provider, MD  donepezil (ARICEPT) 23 MG TABS tablet Take 23 mg by mouth at bedtime.   Yes Historical Provider, MD  hydrochlorothiazide (HYDRODIURIL) 12.5 MG tablet Take 12.5 mg by mouth daily.    Yes Historical Provider, MD  HYDROcodone-acetaminophen (NORCO/VICODIN) 5-325 MG tablet Take 1 tablet by mouth every 6 (six) hours as needed for pain. for pain 12/07/16  Yes Historical Provider, MD  latanoprost (XALATAN) 0.005 % ophthalmic solution Place 1 drop into the left eye at bedtime.  12/13/14  Yes Historical Provider, MD  levofloxacin (LEVAQUIN) 750 MG tablet Take 750 mg by mouth daily. Started 1/9 x 10 days 12/07/16  Yes Historical Provider, MD  memantine (NAMENDA) 5 MG tablet Take 10 mg by mouth daily. 11/02/16  Yes Historical Provider, MD  Multiple Vitamins-Minerals (EYE VITAMINS PO) Take 1 tablet by mouth daily.    Yes Historical Provider, MD    Family History History reviewed. No pertinent family history.  Social History Social History  Substance Use Topics  . Smoking status: Never Smoker  . Smokeless tobacco: Never Used  . Alcohol use No     Allergies   Patient has no known allergies.   Review of Systems Review of Systems  All other systems negative except as documented in the HPI. All pertinent positives and negatives as reviewed in the  HPI. Physical Exam Updated Vital Signs BP (!) 111/54 (BP Location: Right Arm)   Pulse 75   Temp 97.8 F (36.6 C) (Oral)   Resp 16   Ht 5\' 4"  (1.626 m)   Wt 60.6 kg   SpO2 93%   BMI 22.93 kg/m   Physical Exam  Constitutional: She is oriented to person, place, and time. She appears well-developed and well-nourished. No distress.  HENT:  Head: Normocephalic and atraumatic.  Mouth/Throat: Oropharynx is clear and moist. Mucous  membranes are dry.  Eyes: Pupils are equal, round, and reactive to light.  Neck: Normal range of motion. Neck supple.  Cardiovascular: Normal rate, regular rhythm and normal heart sounds.  Exam reveals no gallop and no friction rub.   No murmur heard. Pulmonary/Chest: Effort normal. No respiratory distress. She has no wheezes. She has rhonchi.  Neurological: She is alert and oriented to person, place, and time. She exhibits normal muscle tone. Coordination normal.  Skin: Skin is warm and dry. No rash noted. No erythema.  Psychiatric: She has a normal mood and affect. Her behavior is normal.  Nursing note and vitals reviewed.    ED Treatments / Results  Labs (all labs ordered are listed, but only abnormal results are displayed) Labs Reviewed  BASIC METABOLIC PANEL - Abnormal; Notable for the following:       Result Value   Sodium 134 (*)    Chloride 96 (*)    Calcium 8.3 (*)    GFR calc non Af Amer 54 (*)    All other components within normal limits  URINALYSIS, ROUTINE W REFLEX MICROSCOPIC - Abnormal; Notable for the following:    Color, Urine AMBER (*)    APPearance HAZY (*)    Ketones, ur 20 (*)    Protein, ur 100 (*)    Bacteria, UA RARE (*)    Squamous Epithelial / LPF 0-5 (*)    All other components within normal limits  CBC WITH DIFFERENTIAL/PLATELET - Abnormal; Notable for the following:    Lymphs Abs 0.4 (*)    All other components within normal limits  TSH - Abnormal; Notable for the following:    TSH 6.387 (*)    All other components within normal limits  BASIC METABOLIC PANEL - Abnormal; Notable for the following:    Sodium 133 (*)    Chloride 98 (*)    Calcium 7.6 (*)    All other components within normal limits  CBC - Abnormal; Notable for the following:    Hemoglobin 11.4 (*)    HCT 34.7 (*)    All other components within normal limits  CULTURE, EXPECTORATED SPUTUM-ASSESSMENT  CULTURE, BLOOD (ROUTINE X 2)  CULTURE, BLOOD (ROUTINE X 2)  GRAM STAIN    BRAIN NATRIURETIC PEPTIDE  STREP PNEUMONIAE URINARY ANTIGEN  MAGNESIUM  PHOSPHORUS  HIV ANTIBODY (ROUTINE TESTING)  LEGIONELLA PNEUMOPHILA SEROGP 1 UR AG  CBG MONITORING, ED  I-STAT CG4 LACTIC ACID, ED    EKG  EKG Interpretation None       Radiology Dg Chest 2 View  Result Date: 12/12/2016 CLINICAL DATA:  Pt c/o fever, syncopal episode today, cough. Pt diagnosed with pneumonia on Monday. EXAM: CHEST  2 VIEW COMPARISON:  12/06/2016 FINDINGS: Right upper lobe airspace opacity. Kerley B-lines are present compatible with at least interstitial pulmonary edema. Some of the right upper lobe opacity is chronic. The moderate enlargement of the cardiopericardial silhouette. Tortuosity of the thoracic aorta. Biapical pleuroparenchymal scarring. Thoracolumbar scoliosis. Lumbar compression fracture likely  at L4. This has been present on prior exams including 11/04/2015. Chronic T7 compression fracture. Compression fracture T6 is stable from 12/06/2016 but new from 11/04/2015. IMPRESSION: 1. Moderate enlargement of the cardiopericardial silhouette with interstitial edema and mild congestive heart failure. 2. Right upper lobe airspace opacity may reflect superimposed pneumonia, although some of this is chronic. There is also biapical pleuroparenchymal scarring. 3. Chronic compression fractures at T7 and L4. A compression fracture T6 is stable from 12/06/2016 but new from 11/04/2015. Electronically Signed   By: Gaylyn Rong M.D.   On: 12/12/2016 12:16    Procedures Procedures (including critical care time)  Medications Ordered in ED Medications  vancomycin (VANCOCIN) IVPB 1000 mg/200 mL premix (0 mg Intravenous Stopped 12/12/16 1706)  ceFEPIme (MAXIPIME) 1 g in dextrose 5 % 50 mL IVPB (1 g Intravenous Given 12/13/16 1450)  aspirin EC tablet 81 mg (81 mg Oral Given 12/13/16 1147)  HYDROcodone-acetaminophen (NORCO/VICODIN) 5-325 MG per tablet 1 tablet (not administered)  memantine (NAMENDA) tablet  10 mg (10 mg Oral Given 12/13/16 1146)  latanoprost (XALATAN) 0.005 % ophthalmic solution 1 drop (1 drop Left Eye Given 12/12/16 2200)  donepezil (ARICEPT) tablet 23 mg (23 mg Oral Given 12/12/16 2159)  clopidogrel (PLAVIX) tablet 75 mg (75 mg Oral Given 12/13/16 1146)  guaiFENesin (MUCINEX) 12 hr tablet 600 mg (600 mg Oral Given 12/12/16 2159)  ipratropium-albuterol (DUONEB) 0.5-2.5 (3) MG/3ML nebulizer solution 3 mL (not administered)  enoxaparin (LOVENOX) injection 40 mg (40 mg Subcutaneous Not Given 12/12/16 2159)  0.9 %  sodium chloride infusion ( Intravenous New Bag/Given 12/12/16 2012)  acetaminophen (TYLENOL) tablet 650 mg (not administered)    Or  acetaminophen (TYLENOL) suppository 650 mg (not administered)  ondansetron (ZOFRAN) tablet 4 mg (not administered)    Or  ondansetron (ZOFRAN) injection 4 mg (not administered)  feeding supplement (ENSURE ENLIVE) (ENSURE ENLIVE) liquid 237 mL (237 mLs Oral Given 12/13/16 1147)  benzonatate (TESSALON) capsule 100 mg (100 mg Oral Given 12/13/16 0245)  predniSONE (DELTASONE) tablet 40 mg (not administered)  ceFEPIme (MAXIPIME) 1 g in dextrose 5 % 50 mL IVPB (0 g Intravenous Stopped 12/12/16 1550)     Initial Impression / Assessment and Plan / ED Course  I have reviewed the triage vital signs and the nursing notes.  Pertinent labs & imaging results that were available during my care of the patient were reviewed by me and considered in my medical decision making (see chart for details).  Clinical Course     The patient will need admission to the hospital for failed outpatient therapy of pneumonia.  Patient has taken a full course of Levaquin and is getting worsening symptoms with dehydration she has not been eating or drinking.  During this timeframe very well.  The patient does appear dry on examination.  Spoke with the [facility] hospitalist, who will admit  Final Clinical Impressions(s) / ED Diagnoses   Final diagnoses:  Community  acquired pneumonia, unspecified laterality    New Prescriptions Current Discharge Medication List       Charlestine Night, PA-C 12/13/16 1628    Rolland Porter, MD 12/21/16 2048

## 2016-12-14 ENCOUNTER — Inpatient Hospital Stay (HOSPITAL_COMMUNITY): Payer: Medicare Other

## 2016-12-14 LAB — MRSA PCR SCREENING: MRSA by PCR: NEGATIVE

## 2016-12-14 LAB — HIV ANTIBODY (ROUTINE TESTING W REFLEX): HIV SCREEN 4TH GENERATION: NONREACTIVE

## 2016-12-14 MED ORDER — IPRATROPIUM-ALBUTEROL 0.5-2.5 (3) MG/3ML IN SOLN
3.0000 mL | Freq: Four times a day (QID) | RESPIRATORY_TRACT | Status: DC
  Administered 2016-12-14 (×2): 3 mL via RESPIRATORY_TRACT
  Filled 2016-12-14 (×2): qty 3

## 2016-12-14 MED ORDER — IPRATROPIUM-ALBUTEROL 0.5-2.5 (3) MG/3ML IN SOLN
3.0000 mL | Freq: Three times a day (TID) | RESPIRATORY_TRACT | Status: DC
  Administered 2016-12-15: 3 mL via RESPIRATORY_TRACT
  Filled 2016-12-14: qty 3

## 2016-12-14 NOTE — Progress Notes (Addendum)
PROGRESS NOTE    Deanna Meyers  RUE:[id number] DOB: Feb 27, 1921 DOA: 12/12/2016 PCP: Hadley PenOBBINS,ROBERT A, MD   Brief Narrative: Deanna Meyers is a 83 y.o. female with a history of hypertension, hyperlipidemia, dementia, peripheral vascular disease, glaucoma. She presented with generalized malaise and worsening dyspnea and found to have pneumonia that has been unresponsive to outpatient management addition to hypoxic respiratory failure.   Assessment & Plan:   Principal Problem:   Community acquired pneumonia Active Problems:   Essential hypertension   HLD (hyperlipidemia)   Dehydration   Generalized weakness   Lewy body dementia without behavioral disturbance   Acute respiratory failure with hypoxia (HCC)   CAP (community acquired pneumonia)   Community acquired pneumonia Bronchitis Failed outpatient management. Patient with significant wheezing on exam. Has a history of chronic lung disease of unknown origin possibly secondary to recurrent pneumonias -Continue vancomycin and cefepime -continue prednisone -switch to Duoneb QID -PT evaluation: Home health PT -repeat sputum culture/gram stain -Blood culture pending -CT chest  Acute risk for failure with hypoxia Secondary to pneumonia. -Wean oxygen to room air as tolerated  Essential hypertension Hydrochlorothiazide held on admission secondary to soft blood pressures. Current on lower side -continue to hold hydrochlorothiazide  Hyperlipidemia Not on outpatient management  Lewy body dementia -continue donepezil and memantine    DVT prophylaxis: Lovenox Code Status: Full code Family Communication: Daughters at bedside Disposition Plan: Anticipate discharge home in 1-2 days   Consultants:   None  Procedures:  None  Antimicrobials:  Vancomycin (1/14>>  Cefepime (1/14>>    Subjective: Patient reports feeling better. Has dyspnea and productive cough.  Objective: Vitals:   12/13/16 1249  12/13/16 1419 12/13/16 2109 12/14/16 0455  BP:  (!) 111/54 133/61 108/62  Pulse:  75 66 72  Resp:  16 14 16   Temp:  97.8 F (36.6 C) 99.1 F (37.3 C) 98.4 F (36.9 C)  TempSrc:  Oral Oral Oral  SpO2: (!) 84% 93% 99% 93%  Weight:      Height:        Intake/Output Summary (Last 24 hours) at 12/14/16 1421 Last data filed at 12/13/16 1717  Gross per 24 hour  Intake              647 ml  Output                0 ml  Net              647 ml   Filed Weights   12/12/16 1339 12/12/16 1730  Weight: 61.2 kg (135 lb) 60.6 kg (133 lb 9.6 oz)    Examination:  General exam: Appears calm and comfortable Respiratory system: Diffuse wheezing and rhonchi with diminished breath sounds. Respiratory effort normal. Cardiovascular system: S1 & S2 heard, RRR. No murmurs, rubs, gallops or clicks. Gastrointestinal system: Abdomen is nondistended, soft and nontender. Normal bowel sounds heard. Central nervous system: Alert and oriented. No focal neurological deficits. Extremities: No edema. No calf tenderness Skin: No cyanosis. No rashes Psychiatry: Judgement and insight appear normal. Mood & affect appropriate.     Data Reviewed: I have personally reviewed following labs and imaging studies  CBC:  Recent Labs Lab 12/12/16 1348 12/13/16 0437  WBC 6.6 5.0  NEUTROABS 5.6  --   HGB 13.0 11.4*  HCT 39.7 34.7*  MCV 89.4 89.0  PLT 284 249   Basic Metabolic Panel:  Recent Labs Lab 12/12/16 1348 12/12/16 1918 12/13/16 0437  NA 134*  --  133*  K 4.1  --  3.5  CL 96*  --  98*  CO2 27  --  26  GLUCOSE 91  --  94  BUN 16  --  15  CREATININE 0.89  --  0.73  CALCIUM 8.3*  --  7.6*  MG  --  1.8  --   PHOS  --  3.1  --    GFR: Estimated Creatinine Clearance: 37.9 mL/min (by C-G formula based on SCr of 0.73 mg/dL). Liver Function Tests: No results for input(s): AST, ALT, ALKPHOS, BILITOT, PROT, ALBUMIN in the last 168 hours. No results for input(s): LIPASE, AMYLASE in the last 168  hours. No results for input(s): AMMONIA in the last 168 hours. Coagulation Profile: No results for input(s): INR, PROTIME in the last 168 hours. Cardiac Enzymes: No results for input(s): CKTOTAL, CKMB, CKMBINDEX, TROPONINI in the last 168 hours. BNP (last 3 results) No results for input(s): PROBNP in the last 8760 hours. HbA1C: No results for input(s): HGBA1C in the last 72 hours. CBG:  Recent Labs Lab 12/12/16 1123  GLUCAP 95   Lipid Profile: No results for input(s): CHOL, HDL, LDLCALC, TRIG, CHOLHDL, LDLDIRECT in the last 72 hours. Thyroid Function Tests:  Recent Labs  12/12/16 1918  TSH 6.387*   Anemia Panel: No results for input(s): VITAMINB12, FOLATE, FERRITIN, TIBC, IRON, RETICCTPCT in the last 72 hours. Sepsis Labs:  Recent Labs Lab 12/12/16 1408  LATICACIDVEN 1.34    Recent Results (from the past 240 hour(s))  Culture, sputum-assessment     Status: None   Collection Time: 12/12/16  8:55 PM  Result Value Ref Range Status   Specimen Description SPUTUM  Final   Special Requests NONE  Final   Sputum evaluation   Final    Sputum specimen not acceptable for testing.  Please recollect.   CALLED TO Kerin Perna [id number] @ 2228 BY J SCOTTON    Report Status 12/12/2016 FINAL  Final  MRSA PCR Screening     Status: None   Collection Time: 12/14/16 11:20 AM  Result Value Ref Range Status   MRSA by PCR NEGATIVE NEGATIVE Final    Comment:        The GeneXpert MRSA Assay (FDA approved for NASAL specimens only), is one component of a comprehensive MRSA colonization surveillance program. It is not intended to diagnose MRSA infection nor to guide or monitor treatment for MRSA infections.          Radiology Studies: No results found.      Scheduled Meds: . aspirin EC  81 mg Oral Daily  . ceFEPime (MAXIPIME) IV  1 g Intravenous Q24H  . clopidogrel  75 mg Oral Daily  . donepezil  23 mg Oral QHS  . enoxaparin (LOVENOX) injection  40 mg Subcutaneous Q24H   . feeding supplement (ENSURE ENLIVE)  237 mL Oral BID BM  . guaiFENesin  600 mg Oral BID  . ipratropium-albuterol  3 mL Nebulization QID  . latanoprost  1 drop Left Eye QHS  . mouth rinse  15 mL Mouth Rinse BID  . memantine  10 mg Oral Daily  . predniSONE  40 mg Oral Q breakfast  . vancomycin  1,000 mg Intravenous Q24H   Continuous Infusions:   LOS: 1 day     Jacquelin Hawking [facility] 12/14/2016, 2:21 PM Pager: [phone]  If 7PM-7AM, please contact night-coverage [email or web address] Password [email or web address] 12/14/2016, 2:21 PM

## 2016-12-14 NOTE — Evaluation (Signed)
Occupational Therapy Evaluation Patient Details Name: Deanna Meyers MRN: [id number] DOB: 1921/01/21 Today's Date: 12/14/2016    History of Present Illness 83 y.o. female with PMH significant for HTN, HLD, mild dementia, PVD and glaucoma; who presented to ED with complaints of general malaise and worsening SOB. Family also endorses anorexia and decrease fluid intake. Dx of PNA.    Clinical Impression   Pt admitted with malaise and SOB. Pt currently with functional limitations due to the deficits listed below (see OT Problem List).  Pt will benefit from skilled OT to increase their safety and independence with ADL and functional mobility for ADL to facilitate discharge to venue listed below.      Follow Up Recommendations  Home health OT;Supervision/Assistance - 24 hour    Equipment Recommendations  3 in 1 bedside commode    Recommendations for Other Services       Precautions / Restrictions Precautions Precautions: Fall      Mobility Bed Mobility               General bed mobility comments: NT -up in recliner  Transfers Overall transfer level: Needs assistance Equipment used: Rolling walker (2 wheeled) Transfers: Sit to/from Stand Sit to Stand: Min assist         General transfer comment: VCs hand placement    Balance Overall balance assessment: Needs assistance   Sitting balance-Leahy Scale: Good       Standing balance-Leahy Scale: Fair                              ADL Overall ADL's : Needs assistance/impaired                                       General ADL Comments: Pt overall S- min A with ADL activity inlcuding bathing, dressing and toileting.  Pt has 2 daughters that will A at home.  Reccomend  initially upon DC               Pertinent Vitals/Pain       Hand Dominance     Extremity/Trunk Assessment Upper Extremity Assessment Upper Extremity Assessment: Generalized weakness            Communication Communication Communication: No difficulties   Cognition Arousal/Alertness: Awake/alert Behavior During Therapy: WFL for tasks assessed/performed Overall Cognitive Status: Within Functional Limits for tasks assessed                     General Comments       Exercises       Shoulder Instructions      Home Living Family/patient expects to be discharged to:: Private residence Living Arrangements: Alone Available Help at Discharge: Family;Available 24 hours/day (3 daughters can assist)   Home Access: Level entry     Home Layout: One level     Bathroom Shower/Tub: Walk-in shower         Home Equipment: Environmental consultantWalker - 4 wheels;Shower seat          Prior Functioning/Environment Level of Independence: Independent with assistive device(s)                 OT Problem List: Decreased strength;Decreased activity tolerance   OT Treatment/Interventions: Self-care/ADL training;Patient/family education;DME and/or AE instruction    OT Goals(Current goals can be found in the care plan section)  Acute Rehab OT Goals Patient Stated Goal: likes to watch tv and go for walks OT Goal Formulation: With patient Time For Goal Achievement: 12/28/16 Potential to Achieve Goals: Good  OT Frequency: Min 2X/week   Barriers to D/C:            Co-evaluation              End of Session Equipment Utilized During Treatment: Rolling walker Nurse Communication: Mobility status  Activity Tolerance: Patient tolerated treatment well Patient left: in chair;with family/visitor present   Time: 1230-1250 OT Time Calculation (min): 20 min Charges:  OT General Charges $OT Visit: 1 Procedure OT Evaluation $OT Eval Moderate Complexity: 1 Procedure G-Codes:    Einar Crow D 2016-12-21, 3:26 PM

## 2016-12-14 NOTE — Care Management Note (Signed)
Case Management Note  Patient Details  Name: Deanna Meyers MRN: [id number] Date of Birth: 12-Feb-1921  Subjective/Objective:   83 yo admitted with CAP               Action/Plan: From home alone. PT recommendations were for HHPT. This CM met with pt and 2 daughters at bedside to offer choice for [facility] services. [facility] was chosen. This CM contacted company [phone] for referral. Will need MD order prior to discharge. Fax:  [phone]. CM will continue to follow.  Expected Discharge Date:   (unknown)               Expected Discharge Plan:  [place]  In-House Referral:     Discharge planning Services  CM Consult  Post Acute Care Choice:  Home Health Choice offered to:  Patient, Adult Children  DME Arranged:    DME Agency:     HH Arranged:  PT [place]:  [facility]  Status of Service:  In process, will continue to follow  If discussed at Long Length of Stay Meetings, dates discussed:    Additional CommentsLynnell Catalan, RN 12/14/2016, 1:48 PM  [phone]

## 2016-12-14 NOTE — Progress Notes (Signed)
Discussed finding of acute T6 compression fracture with associated ventral cord flattening with Dr. Danielle DessElsner, Neurosurgery. Recommended no surgical intervention at this time.  Deanna Hawkingalph Rutledge Selsor, MD [facility] 12/14/2016, 7:58 PM Pager: [phone]

## 2016-12-15 DIAGNOSIS — J9601 Acute respiratory failure with hypoxia: Secondary | ICD-10-CM

## 2016-12-15 DIAGNOSIS — R5383 Other fatigue: Secondary | ICD-10-CM

## 2016-12-15 DIAGNOSIS — I10 Essential (primary) hypertension: Secondary | ICD-10-CM

## 2016-12-15 DIAGNOSIS — G3183 Dementia with Lewy bodies: Secondary | ICD-10-CM

## 2016-12-15 DIAGNOSIS — J9600 Acute respiratory failure, unspecified whether with hypoxia or hypercapnia: Secondary | ICD-10-CM

## 2016-12-15 DIAGNOSIS — R5381 Other malaise: Secondary | ICD-10-CM

## 2016-12-15 DIAGNOSIS — E860 Dehydration: Secondary | ICD-10-CM

## 2016-12-15 DIAGNOSIS — R531 Weakness: Secondary | ICD-10-CM

## 2016-12-15 DIAGNOSIS — F0280 Dementia in other diseases classified elsewhere without behavioral disturbance: Secondary | ICD-10-CM

## 2016-12-15 DIAGNOSIS — J101 Influenza due to other identified influenza virus with other respiratory manifestations: Secondary | ICD-10-CM

## 2016-12-15 LAB — BASIC METABOLIC PANEL
Anion gap: 7 (ref 5–15)
BUN: 13 mg/dL (ref 6–20)
CO2: 29 mmol/L (ref 22–32)
Calcium: 8.3 mg/dL — ABNORMAL LOW (ref 8.9–10.3)
Chloride: 102 mmol/L (ref 101–111)
Creatinine, Ser: 0.37 mg/dL — ABNORMAL LOW (ref 0.44–1.00)
GFR calc Af Amer: >60 mL/min (ref >60)
GFR calc non Af Amer: >60 mL/min (ref >60)
Glucose, Bld: 123 mg/dL — ABNORMAL HIGH (ref 65–99)
Potassium: 3.7 mmol/L (ref 3.5–5.1)
Sodium: 138 mmol/L (ref 135–145)

## 2016-12-15 LAB — INFLUENZA PANEL BY PCR (TYPE A & B)
INFLBPCR: NEGATIVE
Influenza A By PCR: POSITIVE — AB

## 2016-12-15 MED ORDER — OSELTAMIVIR PHOSPHATE 75 MG PO CAPS: 75.0000 mg | ORAL_CAPSULE | Freq: Two times a day (BID) | ORAL | Status: DC

## 2016-12-15 MED ORDER — OSELTAMIVIR PHOSPHATE 30 MG PO CAPS
30.0000 mg | ORAL_CAPSULE | Freq: Two times a day (BID) | ORAL | Status: DC
  Administered 2016-12-15: 30 mg via ORAL
  Filled 2016-12-15: qty 1

## 2016-12-15 MED ORDER — GUAIFENESIN ER 600 MG PO TB12: 600.0000 mg | ORAL_TABLET | Freq: Two times a day (BID) | ORAL | 0 refills | Status: DC

## 2016-12-15 MED ORDER — PREDNISONE 10 MG PO TABS: 40.0000 mg | ORAL_TABLET | Freq: Every day | ORAL | 0 refills | Status: DC

## 2016-12-15 MED ORDER — LIP MEDEX EX OINT: TOPICAL_OINTMENT | CUTANEOUS | Status: DC | PRN

## 2016-12-15 MED ORDER — OSELTAMIVIR PHOSPHATE 30 MG PO CAPS: 30.0000 mg | ORAL_CAPSULE | Freq: Two times a day (BID) | ORAL | 0 refills | Status: DC

## 2016-12-15 MED ORDER — BENZONATATE 100 MG PO CAPS: 100.0000 mg | ORAL_CAPSULE | Freq: Three times a day (TID) | ORAL | 0 refills | Status: AC | PRN

## 2016-12-15 MED FILL — BENZONATATE 100 MG CAPSULE: 100 | 5 days supply | Qty: 15 | Fill #0

## 2016-12-15 MED FILL — predniSONE 10 MG TABS: 10 | 4 days supply | Qty: 10 | Fill #0

## 2016-12-15 MED FILL — OSELTAMIVIR PHOS 30 MG CAP: 30 | 5 days supply | Qty: 9 | Fill #0

## 2016-12-15 NOTE — Progress Notes (Signed)
Physical Therapy Treatment Patient Details Name: Deanna Meyers MRN: [id number] DOB: [birth year] Today's Date: 12/15/2016    History of Present Illness 83 y.o. female with PMH significant for HTN, HLD, mild dementia, PVD and glaucoma; who presented to ED with complaints of general malaise and worsening SOB. Family also endorses anorexia and decrease fluid intake. Dx of PNA.     PT Comments    Pt with acute T6 compression fracture with no surgical intervention.  Discussed back precautions with family.  o2 sat 91-93% on RA with gait with RW 420'. Con't to recommend HHPT.  Follow Up Recommendations  Home health PT     Equipment Recommendations  None recommended by PT    Recommendations for Other Services       Precautions / Restrictions Precautions Precautions: Fall Precaution Comments: monitor O2 Restrictions Weight Bearing Restrictions: No    Mobility  Bed Mobility               General bed mobility comments: NT -up in recliner  Transfers Overall transfer level: Needs assistance Equipment used: Rolling walker (2 wheeled) Transfers: Sit to/from Stand Sit to Stand: Min guard         General transfer comment: cues for hand placement  Ambulation/Gait Ambulation/Gait assistance: Min guard Ambulation Distance (Feet): 420 Feet Assistive device: Rolling walker (2 wheeled) Gait Pattern/deviations: Step-through pattern;Trunk flexed     General Gait Details: o2 91-93% on room air with some SOB noted, but wearing mask   Stairs            Wheelchair Mobility    Modified Rankin (Stroke Patients Only)       Balance     Sitting balance-Leahy Scale: Good       Standing balance-Leahy Scale: Fair                      Cognition Arousal/Alertness: Awake/alert Behavior During Therapy: WFL for tasks assessed/performed Overall Cognitive Status: Within Functional Limits for tasks assessed                      Exercises       General Comments General comments (skin integrity, edema, etc.): Pt reports she does seated LE therex and encouraged to continue.  discussed compression fx findings with pt and daughter's. Educated on precautions with back and answered their questions.      Pertinent Vitals/Pain Pain Assessment: No/denies pain    Home Living                      Prior Function            PT Goals (current goals can now be found in the care plan section) Acute Rehab PT Goals Patient Stated Goal: likes to watch tv and go for walks PT Goal Formulation: With patient/family Time For Goal Achievement: 12/27/16 Potential to Achieve Goals: Good Progress towards PT goals: Progressing toward goals    Frequency    Min 3X/week      PT Plan Current plan remains appropriate    Co-evaluation             End of Session Equipment Utilized During Treatment: Gait belt Activity Tolerance: Patient tolerated treatment well Patient left: in chair;with call bell/phone within reach;with family/visitor present     Time: 1035-1055 PT Time Calculation (min) (ACUTE ONLY): 20 min  Charges:  $Gait Training: 8-22 mins  G Codes:      Deanna Meyers 12/15/2016, 11:04 AM

## 2016-12-15 NOTE — Progress Notes (Signed)
PHARMACY NOTE:  ANTIMICROBIAL RENAL DOSAGE ADJUSTMENT  Current antimicrobial regimen includes a mismatch between antimicrobial dosage and estimated renal function.  As per policy approved by the Pharmacy & Therapeutics and Medical Executive Committees, the antimicrobial dosage will be adjusted accordingly.  Current antimicrobial dosage:  Tamiflu 75 mg PO BID x 5 days  Indication: Influenza A positive  Renal Function: Estimated Creatinine Clearance: 37.9 mL/min (by C-G formula based on SCr of 0.37 mg/dL (L)).  Using rounded SCr 1 for age, CrCl ~ 33 ml/min.  []      On intermittent HD, scheduled: []      On CRRT    Antimicrobial dosage has been changed to:  Tamiflu 30 mg PO BID x 5 days  Additional comments:   Thank you for allowing pharmacy to be a part of this patient's care.  Lynann Beaverhristine Shawana Knoch PharmD, BCPS Pager [phone] 12/15/2016 6:47 AM

## 2016-12-15 NOTE — Discharge Summary (Addendum)
Physician Discharge Summary  Deanna Meyers H Galiano ZOX:[id number] DOB: [birth year] DOA: 12/12/2016  PCP: Hadley PenOBBINS,ROBERT A, MD  Admit date: 12/12/2016 Discharge date: 12/15/2016  Admitted From: Home Disposition:  Home   Recommendations for Outpatient Follow-up:  1. Follow up with PCP in 1-2 weeks 2. Please obtain BMP/CBC in one week   Home Health: Yes Equipment/Devices: HHPT  Discharge Condition: Stable CODE STATUS:FULL Diet recommendation: Soft  Brief/Interim Summary: 83 y.o. female with a history of hypertension, Deanna Meyers, Deanna Meyers, Deanna Meyers, Deanna Meyers. She presented with generalized malaise and worsening dyspnea and found to have pneumonia that has been unresponsive to outpatient management addition to hypoxic respiratory failure.   Discharge Diagnoses:  Influenza A with pulmonary manifestations -Failed outpatient management. Patient with significant wheezing on exam at time of admission.  -Has a history of chronic lung Meyers of unknown origin possibly secondary to recurrent pneumonias -Continue vancomycin and cefepime initially which was stopped on 12/14/16 and pt remained stable -continue prednisone--taper after d/c -switch to Duoneb QID--home with albuterol MDI -PT evaluation: Home health PT -repeat sputum culture/gram stain -Blood culture neg -CT chest--Apical scarring with bronchiectasis and Mosaic appearance of the right upper lobe. Changes consistent with chronic lung Meyers.  Acute respiratory failure with hypoxia Secondary to influenza with pulmonary manifestation -Wean oxygen to room air as tolerated -Patient ambulated on room air without desaturation prior to discharge  Essential hypertension Hydrochlorothiazide held on admission secondary to soft blood pressures. -Will not restart -BP well controlled  Deanna Meyers Not on outpatient management  Lewy body Deanna Meyers -continue donepezil and memantine   Severe  Malnutrition -continue supplements    Discharge Instructions  Discharge Instructions    Diet - low sodium heart healthy    Complete by:  As directed    Increase activity slowly    Complete by:  As directed      Allergies as of 12/15/2016   No Known Allergies     Medication List    STOP taking these medications   hydrochlorothiazide 12.5 MG tablet Commonly known as:  HYDRODIURIL   levofloxacin 750 MG tablet Commonly known as:  LEVAQUIN     TAKE these medications   ARICEPT 23 MG Tabs tablet Generic drug:  donepezil Take 23 mg by mouth at bedtime.   aspirin EC 81 MG tablet Take 81 mg by mouth daily.   benzonatate 100 MG capsule Commonly known as:  TESSALON Take 1 capsule (100 mg total) by mouth 3 (three) times daily as needed for cough.   clopidogrel 75 MG tablet Commonly known as:  PLAVIX Take 75 mg by mouth daily.   EYE VITAMINS PO Take 1 tablet by mouth daily.   guaiFENesin 600 MG 12 hr tablet Commonly known as:  MUCINEX Take 1 tablet (600 mg total) by mouth 2 (two) times daily.   HYDROcodone-acetaminophen 5-325 MG tablet Commonly known as:  NORCO/VICODIN Take 1 tablet by mouth every 6 (six) hours as needed for pain. for pain   latanoprost 0.005 % ophthalmic solution Commonly known as:  XALATAN Place 1 drop into the left eye at bedtime.   memantine 5 MG tablet Commonly known as:  NAMENDA Take 10 mg by mouth daily.   oseltamivir 30 MG capsule Commonly known as:  TAMIFLU Take 1 capsule (30 mg total) by mouth 2 (two) times daily.   predniSONE 10 MG tablet Commonly known as:  DELTASONE Take 4 tablets (40 mg total) by mouth daily with breakfast. And decrease by one tablet daily Start taking on:  12/16/2016  No Known Allergies  Consultations:  none   Procedures/Studies: Dg Chest 2 View  Result Date: 12/12/2016 CLINICAL DATA:  Pt c/o fever, syncopal episode today, cough. Pt diagnosed with pneumonia on Monday. EXAM: CHEST  2 VIEW  COMPARISON:  12/06/2016 FINDINGS: Right upper lobe airspace opacity. Kerley B-lines are present compatible with at least interstitial pulmonary edema. Some of the right upper lobe opacity is chronic. The moderate enlargement of the cardiopericardial silhouette. Tortuosity of the thoracic aorta. Biapical pleuroparenchymal scarring. Thoracolumbar scoliosis. Lumbar compression fracture likely at L4. This has been present on prior exams including 11/04/2015. Chronic T7 compression fracture. Compression fracture T6 is stable from 12/06/2016 but new from 11/04/2015. IMPRESSION: 1. Moderate enlargement of the cardiopericardial silhouette with interstitial edema and mild congestive heart failure. 2. Right upper lobe airspace opacity may reflect superimposed pneumonia, although some of this is chronic. There is also biapical pleuroparenchymal scarring. 3. Chronic compression fractures at T7 and L4. A compression fracture T6 is stable from 12/06/2016 but new from 11/04/2015. Electronically Signed   By: Gaylyn Rong M.D.   On: 12/12/2016 12:16   Ct Chest Wo Contrast  Result Date: 12/14/2016 CLINICAL DATA:  83 year old hypertensive female with Deanna Meyers presenting with worsening dyspnea. Pneumonia unresponsive to outpatient treatment. Subsequent encounter. EXAM: CT CHEST WITHOUT CONTRAST TECHNIQUE: Multidetector CT imaging of the chest was performed following the standard protocol without IV contrast. COMPARISON:  12/12/2016 chest x-ray. 12/06/2014 and 02/25/2015 chest CT. FINDINGS: Cardiovascular: Cardiomegaly.  Coronary artery calcifications. Aorta valve calcifications. Mildly ectatic ascending thoracic aorta measuring up to 3.9 cm. Mildly ectatic arch and descending thoracic aorta. Prominence of the pulmonary artery without change. Mediastinum/Nodes: Normal/top-normal size lymph nodes. Small or moderate-size hiatal hernia. Lungs/Pleura: Scarring lung apices with bronchiectasis and retraction on the right. Mosaic  appearance right upper lobe. Overall appearance is most suggestive of chronic lung changes possibly hypersensitivity related rather than infectious in origin. Tracheomalacia. Upper Abdomen: No acute abnormality. Musculoskeletal: Remote T7 compression fracture with 90% loss of height and minimal retropulsion posterior inferior aspect. When compared to 12/06/2016 examination, there has been further compression of the T6 vertebral body now with 80% loss height and mild retropulsion posterior inferior aspect. This causes kyphosis with mild ventral cord flattening. IMPRESSION: Better degree of inspiration with relatively similar appearance of lung parenchymal changes as noted above. Overall, findings most suggestive of chronic lung Meyers, possibly hypersensitivity related rather than infectious in origin. Remote T7 compression fracture with 90% loss of height and minimal retropulsion posterior inferior aspect. When compared to 12/06/2016 examination, there has been further compression of the T6 vertebral body now with 80% loss height and mild retropulsion posterior inferior aspect. This causes kyphosis with mild ventral cord flattening. Remainder of findings relatively similar to the recent exam as detailed above. These results will be called to the ordering clinician or representative by the Radiologist Assistant, and communication documented in the PACS or zVision Dashboard. Electronically Signed   By: Lacy Duverney M.D.   On: 12/14/2016 16:27         Discharge Exam: Vitals:   12/14/16 2230 12/15/16 0505  BP: (!) 101/58 (!) 119/59  Pulse: 64 (!) 58  Resp: 16 16  Temp: 98.4 F (36.9 C) 97.7 F (36.5 C)   Vitals:   12/14/16 1927 12/14/16 2230 12/15/16 0505 12/15/16 0958  BP:  (!) 101/58 (!) 119/59   Pulse:  64 (!) 58   Resp:  16 16   Temp:  98.4 F (36.9 C) 97.7 F (36.5 C)  TempSrc:  Oral Oral   SpO2: 94% 92% 91% 93%  Weight:      Height:        General: Pt is alert, awake, not in  acute distress Cardiovascular: RRR, S1/S2 +, no rubs, no gallops Respiratory: Bibasilar rales, no wheezing. Abdominal: Soft, NT, ND, bowel sounds + Extremities: no edema, no cyanosis   The results of significant diagnostics from this hospitalization (including imaging, microbiology, ancillary and laboratory) are listed below for reference.    Significant Diagnostic Studies: Dg Chest 2 View  Result Date: 12/12/2016 CLINICAL DATA:  Pt c/o fever, syncopal episode today, cough. Pt diagnosed with pneumonia on Monday. EXAM: CHEST  2 VIEW COMPARISON:  12/06/2016 FINDINGS: Right upper lobe airspace opacity. Kerley B-lines are present compatible with at least interstitial pulmonary edema. Some of the right upper lobe opacity is chronic. The moderate enlargement of the cardiopericardial silhouette. Tortuosity of the thoracic aorta. Biapical pleuroparenchymal scarring. Thoracolumbar scoliosis. Lumbar compression fracture likely at L4. This has been present on prior exams including 11/04/2015. Chronic T7 compression fracture. Compression fracture T6 is stable from 12/06/2016 but new from 11/04/2015. IMPRESSION: 1. Moderate enlargement of the cardiopericardial silhouette with interstitial edema and mild congestive heart failure. 2. Right upper lobe airspace opacity may reflect superimposed pneumonia, although some of this is chronic. There is also biapical pleuroparenchymal scarring. 3. Chronic compression fractures at T7 and L4. A compression fracture T6 is stable from 12/06/2016 but new from 11/04/2015. Electronically Signed   By: Gaylyn Rong M.D.   On: 12/12/2016 12:16   Ct Chest Wo Contrast  Result Date: 12/14/2016 CLINICAL DATA:  83 year old hypertensive female with Deanna Meyers presenting with worsening dyspnea. Pneumonia unresponsive to outpatient treatment. Subsequent encounter. EXAM: CT CHEST WITHOUT CONTRAST TECHNIQUE: Multidetector CT imaging of the chest was performed following the standard  protocol without IV contrast. COMPARISON:  12/12/2016 chest x-ray. 12/06/2014 and 02/25/2015 chest CT. FINDINGS: Cardiovascular: Cardiomegaly.  Coronary artery calcifications. Aorta valve calcifications. Mildly ectatic ascending thoracic aorta measuring up to 3.9 cm. Mildly ectatic arch and descending thoracic aorta. Prominence of the pulmonary artery without change. Mediastinum/Nodes: Normal/top-normal size lymph nodes. Small or moderate-size hiatal hernia. Lungs/Pleura: Scarring lung apices with bronchiectasis and retraction on the right. Mosaic appearance right upper lobe. Overall appearance is most suggestive of chronic lung changes possibly hypersensitivity related rather than infectious in origin. Tracheomalacia. Upper Abdomen: No acute abnormality. Musculoskeletal: Remote T7 compression fracture with 90% loss of height and minimal retropulsion posterior inferior aspect. When compared to 12/06/2016 examination, there has been further compression of the T6 vertebral body now with 80% loss height and mild retropulsion posterior inferior aspect. This causes kyphosis with mild ventral cord flattening. IMPRESSION: Better degree of inspiration with relatively similar appearance of lung parenchymal changes as noted above. Overall, findings most suggestive of chronic lung Meyers, possibly hypersensitivity related rather than infectious in origin. Remote T7 compression fracture with 90% loss of height and minimal retropulsion posterior inferior aspect. When compared to 12/06/2016 examination, there has been further compression of the T6 vertebral body now with 80% loss height and mild retropulsion posterior inferior aspect. This causes kyphosis with mild ventral cord flattening. Remainder of findings relatively similar to the recent exam as detailed above. These results will be called to the ordering clinician or representative by the Radiologist Assistant, and communication documented in the PACS or zVision  Dashboard. Electronically Signed   By: Lacy Duverney M.D.   On: 12/14/2016 16:27     Microbiology: Recent Results (from the  past 240 hour(s))  Culture, blood (routine x 2) Call MD if unable to obtain prior to antibiotics being given     Status: None (Preliminary result)   Collection Time: 12/12/16  7:18 PM  Result Value Ref Range Status   Specimen Description BLOOD LEFT HAND  Final   Special Requests IN PEDIATRIC BOTTLE 1.5CC  Final   Culture   Final    NO GROWTH 1 DAY Performed at [facility], [address], [place], [place] [address]    Report Status PENDING  Incomplete  Culture, blood (routine x 2) Call MD if unable to obtain prior to antibiotics being given     Status: None (Preliminary result)   Collection Time: 12/12/16  7:18 PM  Result Value Ref Range Status   Specimen Description BLOOD LEFT ANTECUBITAL  Final   Special Requests IN PEDIATRIC BOTTLE 2CC  Final   Culture   Final    NO GROWTH 1 DAY Performed at [facility], [address], [place], [place] [address]    Report Status PENDING  Incomplete  Culture, sputum-assessment     Status: None   Collection Time: 12/12/16  8:55 PM  Result Value Ref Range Status   Specimen Description SPUTUM  Final   Special Requests NONE  Final   Sputum evaluation   Final    Sputum specimen not acceptable for testing.  Please recollect.   CALLED TO Kerin Perna [id number] @ 2228 BY J SCOTTON    Report Status 12/12/2016 FINAL  Final  MRSA PCR Screening     Status: None   Collection Time: 12/14/16 11:20 AM  Result Value Ref Range Status   MRSA by PCR NEGATIVE NEGATIVE Final    Comment:        The GeneXpert MRSA Assay (FDA approved for NASAL specimens only), is one component of a comprehensive MRSA colonization surveillance program. It is not intended to diagnose MRSA infection nor to guide or monitor treatment for MRSA infections.      Labs: Basic Metabolic Panel:  Recent Labs Lab 12/12/16 1348 12/12/16 1918  12/13/16 0437 12/15/16 0410  NA 134*  --  133* 138  K 4.1  --  3.5 3.7  CL 96*  --  98* 102  CO2 27  --  26 29  GLUCOSE 91  --  94 123*  BUN 16  --  15 13  CREATININE 0.89  --  0.73 0.37*  CALCIUM 8.3*  --  7.6* 8.3*  MG  --  1.8  --   --   PHOS  --  3.1  --   --    Liver Function Tests: No results for input(s): AST, ALT, ALKPHOS, BILITOT, PROT, ALBUMIN in the last 168 hours. No results for input(s): LIPASE, AMYLASE in the last 168 hours. No results for input(s): AMMONIA in the last 168 hours. CBC:  Recent Labs Lab 12/12/16 1348 12/13/16 0437  WBC 6.6 5.0  NEUTROABS 5.6  --   HGB 13.0 11.4*  HCT 39.7 34.7*  MCV 89.4 89.0  PLT 284 249   Cardiac Enzymes: No results for input(s): CKTOTAL, CKMB, CKMBINDEX, TROPONINI in the last 168 hours. BNP: Invalid input(s): POCBNP CBG:  Recent Labs Lab 12/12/16 1123  GLUCAP 95    Time coordinating discharge:  Greater than 30 minutes  Signed:  Ndeye Tenorio, DO [facility] Pager: [phone] 12/15/2016, 11:21 AM

## 2016-12-15 NOTE — Progress Notes (Signed)
Home Health [facility]. Successful transmission received. Sandford Crazeora Tykeria Wawrzyniak RN,BSN,NCM [phone]

## 2016-12-15 NOTE — Progress Notes (Signed)
Ms Deanna Meyers d'c'd home with family via wheelchair. Family verbalized understanding of D'C instructions and new medicines to start and meditations to stop

## 2016-12-16 LAB — LEGIONELLA PNEUMOPHILA SEROGP 1 UR AG: L. pneumophila Serogp 1 Ur Ag: NEGATIVE

## 2016-12-18 LAB — CULTURE, BLOOD (ROUTINE X 2)
CULTURE: NO GROWTH
Culture: NO GROWTH

## 2016-12-20 DIAGNOSIS — R2681 Unsteadiness on feet: Secondary | ICD-10-CM | POA: Diagnosis not present

## 2016-12-20 DIAGNOSIS — J984 Other disorders of lung: Secondary | ICD-10-CM | POA: Diagnosis not present

## 2016-12-20 DIAGNOSIS — I10 Essential (primary) hypertension: Secondary | ICD-10-CM | POA: Diagnosis not present

## 2016-12-20 DIAGNOSIS — I739 Peripheral vascular disease, unspecified: Secondary | ICD-10-CM | POA: Diagnosis not present

## 2016-12-20 DIAGNOSIS — F0280 Dementia in other diseases classified elsewhere without behavioral disturbance: Secondary | ICD-10-CM | POA: Diagnosis not present

## 2016-12-20 DIAGNOSIS — M8008XD Age-related osteoporosis with current pathological fracture, vertebra(e), subsequent encounter for fracture with routine healing: Secondary | ICD-10-CM | POA: Diagnosis not present

## 2016-12-20 DIAGNOSIS — Z7902 Long term (current) use of antithrombotics/antiplatelets: Secondary | ICD-10-CM | POA: Diagnosis not present

## 2016-12-20 DIAGNOSIS — Z602 Problems related to living alone: Secondary | ICD-10-CM | POA: Diagnosis not present

## 2016-12-20 DIAGNOSIS — F329 Major depressive disorder, single episode, unspecified: Secondary | ICD-10-CM | POA: Diagnosis not present

## 2016-12-20 DIAGNOSIS — G301 Alzheimer's disease with late onset: Secondary | ICD-10-CM | POA: Diagnosis not present

## 2016-12-20 DIAGNOSIS — Z9181 History of falling: Secondary | ICD-10-CM | POA: Diagnosis not present

## 2016-12-20 DIAGNOSIS — M6281 Muscle weakness (generalized): Secondary | ICD-10-CM | POA: Diagnosis not present

## 2016-12-20 DIAGNOSIS — Z87440 Personal history of urinary (tract) infections: Secondary | ICD-10-CM | POA: Diagnosis not present

## 2016-12-21 DIAGNOSIS — J111 Influenza due to unidentified influenza virus with other respiratory manifestations: Secondary | ICD-10-CM | POA: Diagnosis not present

## 2016-12-21 DIAGNOSIS — R5381 Other malaise: Secondary | ICD-10-CM | POA: Diagnosis not present

## 2016-12-21 DIAGNOSIS — E559 Vitamin D deficiency, unspecified: Secondary | ICD-10-CM | POA: Diagnosis not present

## 2016-12-21 DIAGNOSIS — R5383 Other fatigue: Secondary | ICD-10-CM | POA: Diagnosis not present

## 2016-12-23 DIAGNOSIS — F0280 Dementia in other diseases classified elsewhere without behavioral disturbance: Secondary | ICD-10-CM | POA: Diagnosis not present

## 2016-12-23 DIAGNOSIS — R2681 Unsteadiness on feet: Secondary | ICD-10-CM | POA: Diagnosis not present

## 2016-12-23 DIAGNOSIS — M8008XD Age-related osteoporosis with current pathological fracture, vertebra(e), subsequent encounter for fracture with routine healing: Secondary | ICD-10-CM | POA: Diagnosis not present

## 2016-12-23 DIAGNOSIS — G301 Alzheimer's disease with late onset: Secondary | ICD-10-CM | POA: Diagnosis not present

## 2016-12-23 DIAGNOSIS — M6281 Muscle weakness (generalized): Secondary | ICD-10-CM | POA: Diagnosis not present

## 2016-12-23 DIAGNOSIS — I10 Essential (primary) hypertension: Secondary | ICD-10-CM | POA: Diagnosis not present

## 2016-12-28 DIAGNOSIS — G301 Alzheimer's disease with late onset: Secondary | ICD-10-CM | POA: Diagnosis not present

## 2016-12-28 DIAGNOSIS — M6281 Muscle weakness (generalized): Secondary | ICD-10-CM | POA: Diagnosis not present

## 2016-12-28 DIAGNOSIS — F0280 Dementia in other diseases classified elsewhere without behavioral disturbance: Secondary | ICD-10-CM | POA: Diagnosis not present

## 2016-12-28 DIAGNOSIS — R2681 Unsteadiness on feet: Secondary | ICD-10-CM | POA: Diagnosis not present

## 2016-12-28 DIAGNOSIS — I10 Essential (primary) hypertension: Secondary | ICD-10-CM | POA: Diagnosis not present

## 2016-12-28 DIAGNOSIS — M8008XD Age-related osteoporosis with current pathological fracture, vertebra(e), subsequent encounter for fracture with routine healing: Secondary | ICD-10-CM | POA: Diagnosis not present

## 2016-12-31 ENCOUNTER — Encounter: Payer: Self-pay | Admitting: Pulmonary Disease

## 2016-12-31 ENCOUNTER — Ambulatory Visit (INDEPENDENT_AMBULATORY_CARE_PROVIDER_SITE_OTHER): Payer: Medicare Other | Admitting: Pulmonary Disease

## 2016-12-31 DIAGNOSIS — R0600 Dyspnea, unspecified: Secondary | ICD-10-CM | POA: Diagnosis not present

## 2016-12-31 DIAGNOSIS — J101 Influenza due to other identified influenza virus with other respiratory manifestations: Secondary | ICD-10-CM

## 2016-12-31 DIAGNOSIS — J479 Bronchiectasis, uncomplicated: Secondary | ICD-10-CM | POA: Diagnosis not present

## 2016-12-31 NOTE — Progress Notes (Signed)
   Subjective:    Patient ID: Deanna Meyers, female    DOB: [birth year], 83 y.o.   MRN: [id number]  HPI  83 year old never smoker who was last seen by my partner Dr. Marilu Favrelarence in 2016 for dyspnea and exertion, testing showed evidence of chronic lung disease but no evidence of airway obstruction.  She also has Lewy body dementia and peripheral vascular disease. CT imaging has shown hiatal hernia and vertebral fracture  She was admitted 11/2016 with influenza A with bronchospasm and treated with bronchodilators, empiric antibiotics and prednisone. On discharge hypoxia had resolved.  She feels much improved but does have some residual dyspnea and mild chest pain and left scapular region  11/2016 CT chest Apical scarring with bronchiectasis and Mosaic appearance of the right upper lobe. Changes consistent with chronic lung disease I reviewed these films  Significant tests/ events  Echo 01/2015 diastolic dysfunction, elevated LV filling pressures, normal LVEF, normal RVSP  PFTs 01/2015 no evidence of airway obstruction , decreased lung volumes, DLCO 33%  CT angio 01/2015 neg PE, biapical bronchiectasis, T7 vertebral fracture   Past Medical History:  Diagnosis Date  . Discoloration of skin of toe 08/30/2011   LA doppler - bilateral ABI's - no evidence of arterial insufficiency; bilateral PVRs - normal waveforms; bilateral toe pressures - mod/severe perfusion for healing  . Hyperlipidemia   . Hypertension   . PVD (peripheral vascular disease) (HCC)    decreased blood flow to toes     Review of Systems neg for any significant sore throat, dysphagia, itching, sneezing, nasal congestion or excess/ purulent secretions, fever, chills, sweats, unintended wt loss, pleuritic or exertional cp, hempoptysis, orthopnea pnd or change in chronic leg swelling. Also denies presyncope, palpitations, heartburn, abdominal pain, nausea, vomiting, diarrhea or change in bowel or urinary habits,  dysuria,hematuria, rash, arthralgias, visual complaints, headache, numbness weakness or ataxia.     Objective:   Physical Exam  Gen. Pleasant, frail, elderly,, in no distress, normal affect ENT - no lesions, no post nasal drip Neck: No JVD, no thyromegaly, no carotid bruits Lungs: no use of accessory muscles, no dullness to percussion, Bilateral suprascapular rales , no rhonchi  Cardiovascular: Rhythm regular, heart sounds  normal, no murmurs or gallops, 1+  peripheral edema Abdomen: soft and non-tender, no hepatosplenomegaly, BS normal. Musculoskeletal: No deformities, no cyanosis or clubbing Neuro:  alert, non focal       Assessment & Plan:

## 2016-12-31 NOTE — Assessment & Plan Note (Signed)
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

## 2016-12-31 NOTE — Patient Instructions (Signed)
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

## 2016-12-31 NOTE — Assessment & Plan Note (Signed)
Resolved

## 2016-12-31 NOTE — Assessment & Plan Note (Signed)
Multifactorial-could be related to pulmonary scarring and bronchiectasis from old infection with some degree of diastolic dysfunction  Discussed weight-based dosing of thiazide

## 2017-01-03 DIAGNOSIS — M6281 Muscle weakness (generalized): Secondary | ICD-10-CM | POA: Diagnosis not present

## 2017-01-03 DIAGNOSIS — R2681 Unsteadiness on feet: Secondary | ICD-10-CM | POA: Diagnosis not present

## 2017-01-03 DIAGNOSIS — M8008XD Age-related osteoporosis with current pathological fracture, vertebra(e), subsequent encounter for fracture with routine healing: Secondary | ICD-10-CM | POA: Diagnosis not present

## 2017-01-03 DIAGNOSIS — I10 Essential (primary) hypertension: Secondary | ICD-10-CM | POA: Diagnosis not present

## 2017-01-03 DIAGNOSIS — F0280 Dementia in other diseases classified elsewhere without behavioral disturbance: Secondary | ICD-10-CM | POA: Diagnosis not present

## 2017-01-03 DIAGNOSIS — G301 Alzheimer's disease with late onset: Secondary | ICD-10-CM | POA: Diagnosis not present

## 2017-01-13 DIAGNOSIS — G301 Alzheimer's disease with late onset: Secondary | ICD-10-CM | POA: Diagnosis not present

## 2017-01-13 DIAGNOSIS — F0280 Dementia in other diseases classified elsewhere without behavioral disturbance: Secondary | ICD-10-CM | POA: Diagnosis not present

## 2017-01-13 DIAGNOSIS — M8008XD Age-related osteoporosis with current pathological fracture, vertebra(e), subsequent encounter for fracture with routine healing: Secondary | ICD-10-CM | POA: Diagnosis not present

## 2017-01-13 DIAGNOSIS — M6281 Muscle weakness (generalized): Secondary | ICD-10-CM | POA: Diagnosis not present

## 2017-01-13 DIAGNOSIS — I10 Essential (primary) hypertension: Secondary | ICD-10-CM | POA: Diagnosis not present

## 2017-01-13 DIAGNOSIS — R2681 Unsteadiness on feet: Secondary | ICD-10-CM | POA: Diagnosis not present

## 2017-02-08 DIAGNOSIS — H401131 Primary open-angle glaucoma, bilateral, mild stage: Secondary | ICD-10-CM | POA: Diagnosis not present

## 2017-02-08 DIAGNOSIS — H353131 Nonexudative age-related macular degeneration, bilateral, early dry stage: Secondary | ICD-10-CM | POA: Diagnosis not present

## 2017-02-09 DIAGNOSIS — I10 Essential (primary) hypertension: Secondary | ICD-10-CM | POA: Diagnosis not present

## 2017-02-09 DIAGNOSIS — J479 Bronchiectasis, uncomplicated: Secondary | ICD-10-CM | POA: Diagnosis not present

## 2017-02-09 DIAGNOSIS — F0390 Unspecified dementia without behavioral disturbance: Secondary | ICD-10-CM | POA: Diagnosis not present

## 2017-06-14 DIAGNOSIS — H401131 Primary open-angle glaucoma, bilateral, mild stage: Secondary | ICD-10-CM | POA: Diagnosis not present

## 2017-08-23 DIAGNOSIS — E782 Mixed hyperlipidemia: Secondary | ICD-10-CM | POA: Diagnosis not present

## 2017-08-23 DIAGNOSIS — G3183 Dementia with Lewy bodies: Secondary | ICD-10-CM | POA: Diagnosis not present

## 2017-08-23 DIAGNOSIS — I10 Essential (primary) hypertension: Secondary | ICD-10-CM | POA: Diagnosis not present

## 2017-08-23 DIAGNOSIS — Z0000 Encounter for general adult medical examination without abnormal findings: Secondary | ICD-10-CM | POA: Diagnosis not present

## 2017-08-23 DIAGNOSIS — R5381 Other malaise: Secondary | ICD-10-CM | POA: Diagnosis not present

## 2017-08-23 DIAGNOSIS — E559 Vitamin D deficiency, unspecified: Secondary | ICD-10-CM | POA: Diagnosis not present

## 2017-08-23 DIAGNOSIS — R5383 Other fatigue: Secondary | ICD-10-CM | POA: Diagnosis not present

## 2017-08-23 DIAGNOSIS — Z23 Encounter for immunization: Secondary | ICD-10-CM | POA: Diagnosis not present

## 2017-08-23 DIAGNOSIS — Z8673 Personal history of transient ischemic attack (TIA), and cerebral infarction without residual deficits: Secondary | ICD-10-CM | POA: Diagnosis not present

## 2017-08-23 DIAGNOSIS — F0280 Dementia in other diseases classified elsewhere without behavioral disturbance: Secondary | ICD-10-CM | POA: Diagnosis not present

## 2017-08-23 DIAGNOSIS — F419 Anxiety disorder, unspecified: Secondary | ICD-10-CM | POA: Diagnosis not present

## 2017-09-12 DIAGNOSIS — E038 Other specified hypothyroidism: Secondary | ICD-10-CM | POA: Diagnosis not present

## 2017-10-14 IMAGING — CT CT CHEST W/O CM
2 of 4 series · 15 of 36 positions shown, 18 images · non-contrast
Comparison: 12/12/2016 chest x-ray. 12/06/2014 and 02/25/2015 chest
CT.

CLINICAL DATA: [AGE] hypertensive female with dementia
presenting with worsening dyspnea. Pneumonia unresponsive to
outpatient treatment. Subsequent encounter.

EXAM:
CT CHEST WITHOUT CONTRAST
TECHNIQUE: Multidetector CT imaging of the chest was performed following the
standard protocol without IV contrast.

[Series 4: super d · axial · 0.66mm/px · z∈[+113,+325]mm · 12 of 240 slices shown, 15 images]
[im 14/240  mediastinal]
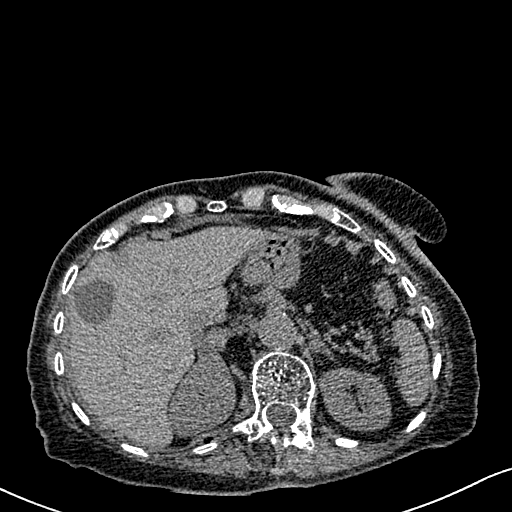
[im 14/240  lung]
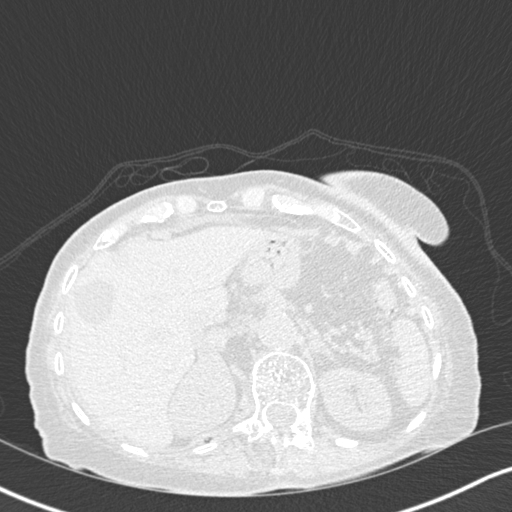
[im 40/240  lung]
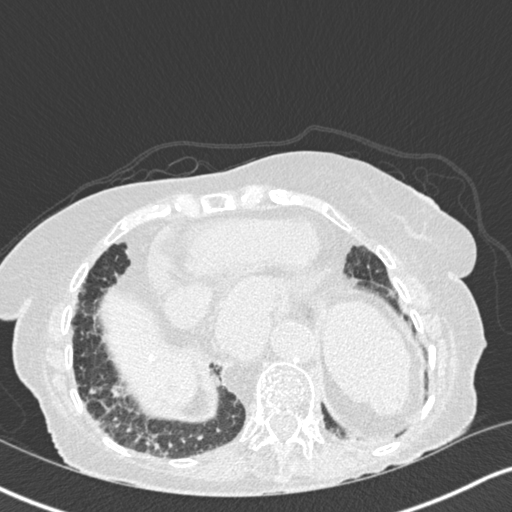
[im 54/240  lung]
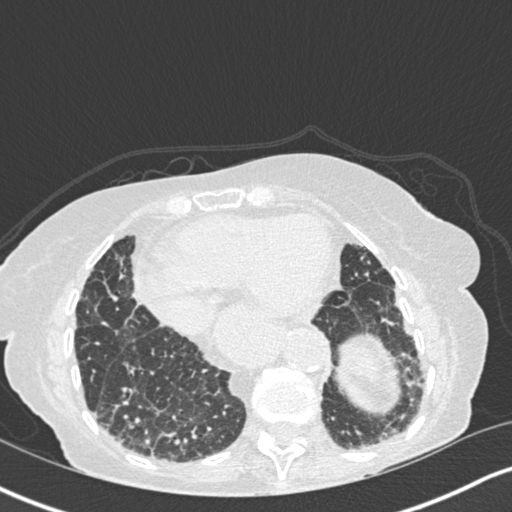
[im 67/240  lung]
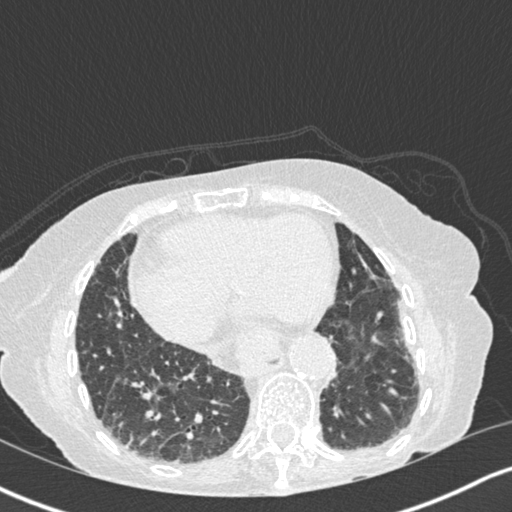
[im 93/240  mediastinal]
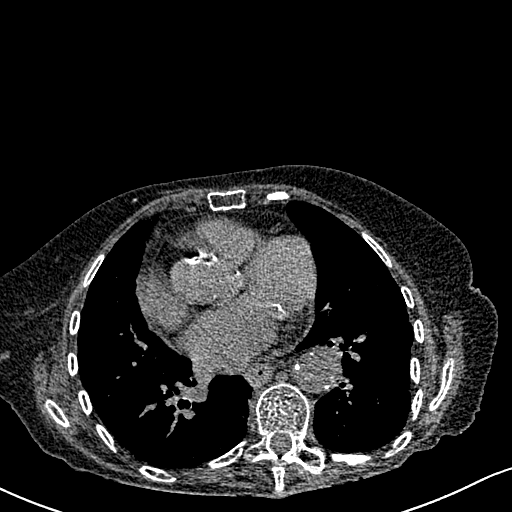
[im 93/240  lung]
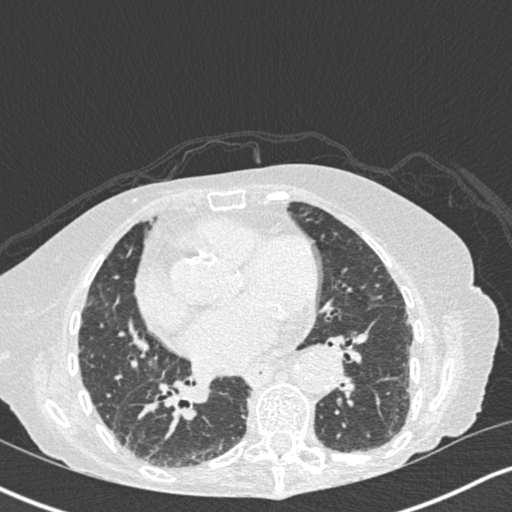
[im 107/240  lung]
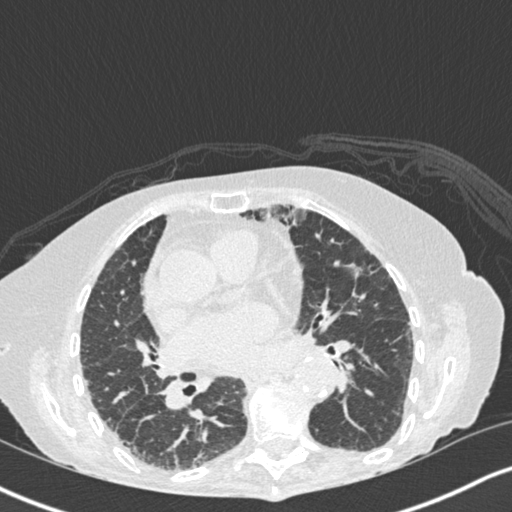
[im 133/240  lung]
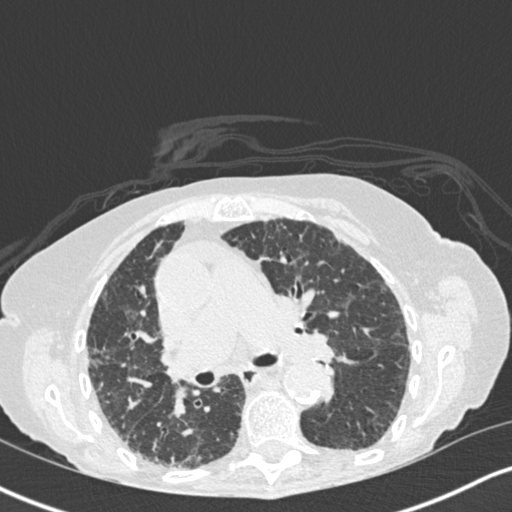
[im 147/240  lung]
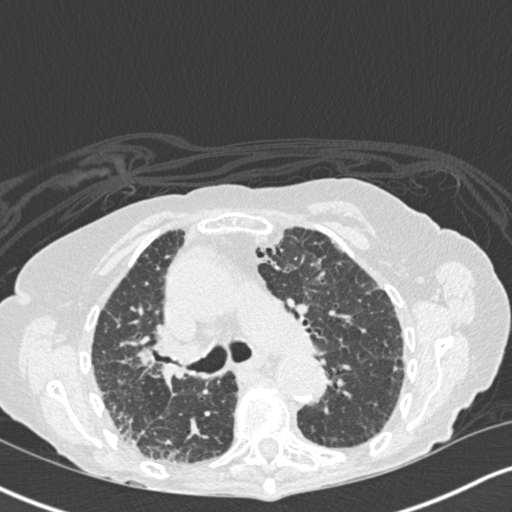
[im 173/240  mediastinal]
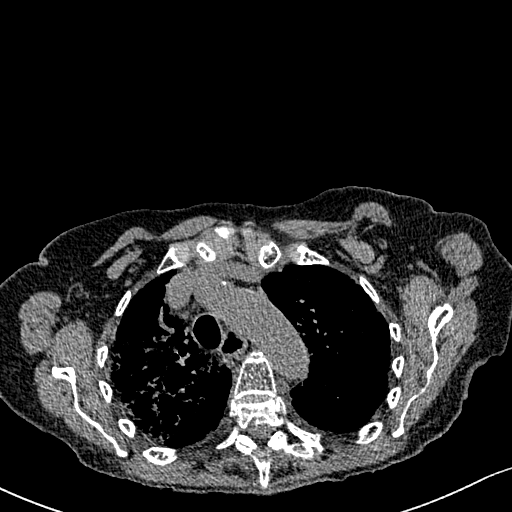
[im 173/240  lung]
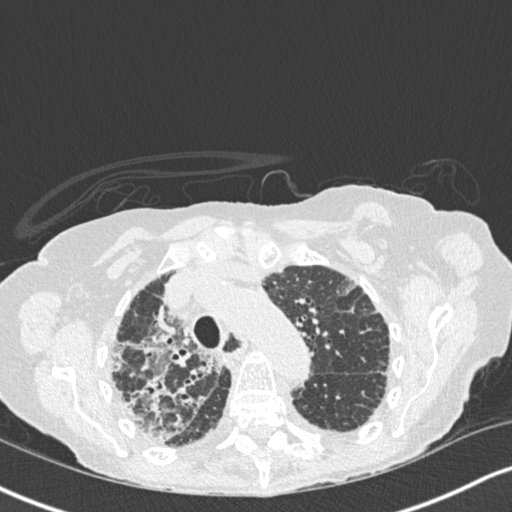
[im 186/240  lung]
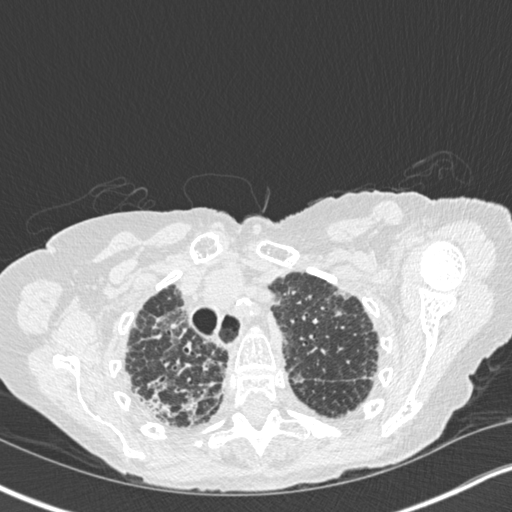
[im 200/240  lung]
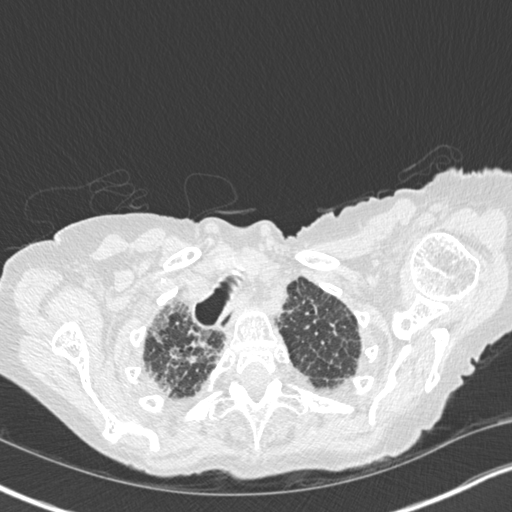
[im 226/240  lung]
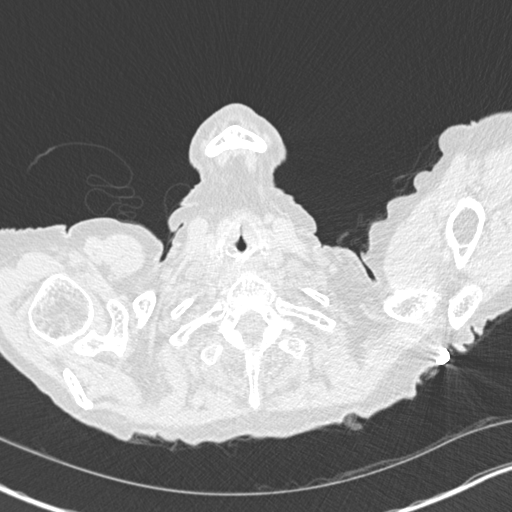

[Series 6: coronal · coronal · 0.59mm/px · 3 of 118 slices shown]
[im 24/118  lung]
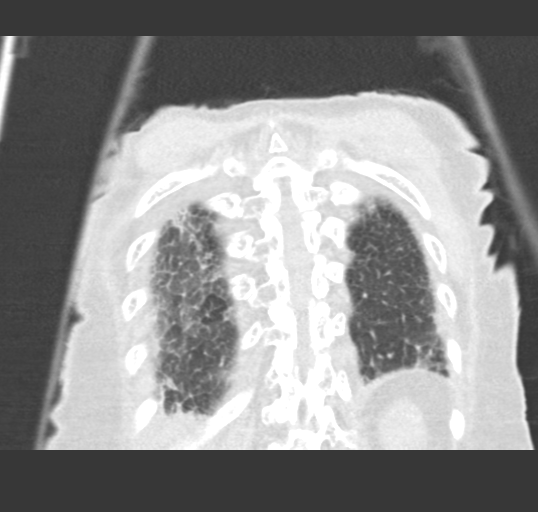
[im 47/118  lung]
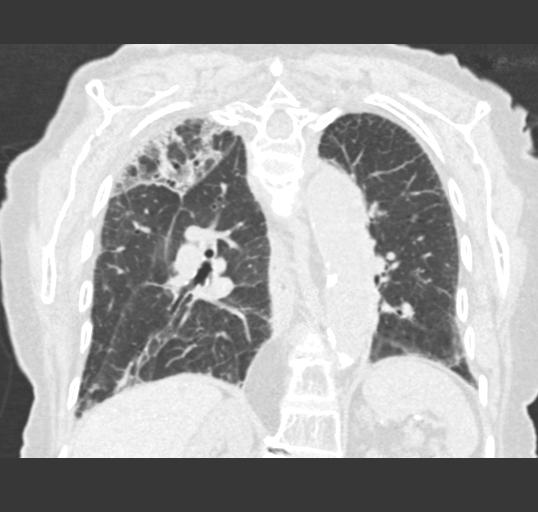
[im 71/118  lung]
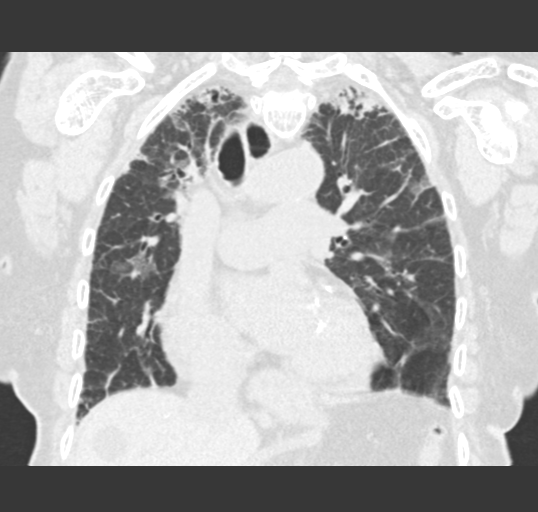

[15 of 36 positions shown; findings below may reference images not displayed]

FINDINGS: Cardiovascular: Cardiomegaly.  Coronary artery calcifications.

Aorta valve calcifications. Mildly ectatic ascending thoracic aorta
measuring up to 3.9 cm. Mildly ectatic arch and descending thoracic
aorta. Prominence of the pulmonary artery without change.

Mediastinum/Nodes: Normal/top-normal size lymph nodes. Small or
moderate-size hiatal hernia.

Lungs/Pleura: Scarring lung apices with bronchiectasis and
retraction on the right. Mosaic appearance right upper lobe. Overall
appearance is most suggestive of chronic lung changes possibly
hypersensitivity related rather than infectious in origin.
Tracheomalacia.

Upper Abdomen: No acute abnormality.

Musculoskeletal: Remote T7 compression fracture with 90% loss of
height and minimal retropulsion posterior inferior aspect. When
compared to 12/06/2016 examination, there has been further
compression of the T6 vertebral body now with 80% loss height and
mild retropulsion posterior inferior aspect. This causes kyphosis
with mild ventral cord flattening.
IMPRESSION: Better degree of inspiration with relatively similar appearance of
lung parenchymal changes as noted above. Overall, findings most
suggestive of chronic lung disease, possibly hypersensitivity
related rather than infectious in origin.

Remote T7 compression fracture with 90% loss of height and minimal
retropulsion posterior inferior aspect.

When compared to 12/06/2016 examination, there has been further
compression of the T6 vertebral body now with 80% loss height and
mild retropulsion posterior inferior aspect. This causes kyphosis
with mild ventral cord flattening.

Remainder of findings relatively similar to the recent exam as
detailed above.

These results will be called to the ordering clinician or
representative by the Radiologist Assistant, and communication
documented in the PACS or zVision Dashboard.

## 2017-10-17 DIAGNOSIS — H401131 Primary open-angle glaucoma, bilateral, mild stage: Secondary | ICD-10-CM | POA: Diagnosis not present

## 2018-02-03 ENCOUNTER — Emergency Department (HOSPITAL_COMMUNITY)
Admission: EM | Admit: 2018-02-03 | Discharge: 2018-02-03 | Disposition: A | Discharge status: Home / Self Care | Payer: Medicare Other | Attending: Emergency Medicine | Admitting: Emergency Medicine

## 2018-02-03 ENCOUNTER — Emergency Department (HOSPITAL_COMMUNITY): Payer: Medicare Other

## 2018-02-03 ENCOUNTER — Encounter (HOSPITAL_COMMUNITY): Payer: Self-pay | Admitting: Emergency Medicine

## 2018-02-03 DIAGNOSIS — R0602 Shortness of breath: Secondary | ICD-10-CM | POA: Diagnosis not present

## 2018-02-03 DIAGNOSIS — Z79899 Other long term (current) drug therapy: Secondary | ICD-10-CM | POA: Insufficient documentation

## 2018-02-03 DIAGNOSIS — R410 Disorientation, unspecified: Secondary | ICD-10-CM | POA: Insufficient documentation

## 2018-02-03 DIAGNOSIS — Z7902 Long term (current) use of antithrombotics/antiplatelets: Secondary | ICD-10-CM | POA: Insufficient documentation

## 2018-02-03 DIAGNOSIS — I10 Essential (primary) hypertension: Secondary | ICD-10-CM | POA: Diagnosis not present

## 2018-02-03 DIAGNOSIS — E039 Hypothyroidism, unspecified: Secondary | ICD-10-CM | POA: Diagnosis not present

## 2018-02-03 DIAGNOSIS — M6281 Muscle weakness (generalized): Secondary | ICD-10-CM | POA: Insufficient documentation

## 2018-02-03 DIAGNOSIS — F05 Delirium due to known physiological condition: Secondary | ICD-10-CM

## 2018-02-03 DIAGNOSIS — Z7982 Long term (current) use of aspirin: Secondary | ICD-10-CM | POA: Diagnosis not present

## 2018-02-03 HISTORY — DX: Disorder of thyroid, unspecified: E07.9

## 2018-02-03 LAB — TROPONIN I: Troponin I: <0.03 ng/mL (ref <0.03)

## 2018-02-03 LAB — URINALYSIS, ROUTINE W REFLEX MICROSCOPIC
BILIRUBIN URINE: NEGATIVE
GLUCOSE, UA: NEGATIVE mg/dL
Hgb urine dipstick: NEGATIVE
Ketones, ur: NEGATIVE mg/dL
Leukocytes, UA: NEGATIVE
Nitrite: NEGATIVE
PH: 9 — AB (ref 5.0–8.0)
Protein, ur: NEGATIVE mg/dL
SPECIFIC GRAVITY, URINE: 1.005 (ref 1.005–1.030)

## 2018-02-03 LAB — COMPREHENSIVE METABOLIC PANEL
ALT: 15 U/L (ref 14–54)
ANION GAP: 10 (ref 5–15)
AST: 26 U/L (ref 15–41)
Albumin: 3.5 g/dL (ref 3.5–5.0)
Alkaline Phosphatase: 59 U/L (ref 38–126)
BUN: 15 mg/dL (ref 6–20)
CHLORIDE: 105 mmol/L (ref 101–111)
CO2: 27 mmol/L (ref 22–32)
Calcium: 8.9 mg/dL (ref 8.9–10.3)
Creatinine, Ser: 0.84 mg/dL (ref 0.44–1.00)
GFR calc Af Amer: >60 mL/min (ref >60)
GFR calc non Af Amer: 58 mL/min — ABNORMAL LOW (ref >60)
GLUCOSE: 89 mg/dL (ref 65–99)
POTASSIUM: 4.8 mmol/L (ref 3.5–5.1)
SODIUM: 142 mmol/L (ref 135–145)
TOTAL PROTEIN: 6.4 g/dL — AB (ref 6.5–8.1)
Total Bilirubin: 0.6 mg/dL (ref 0.3–1.2)

## 2018-02-03 LAB — CBC
HEMATOCRIT: 38 % (ref 36.0–46.0)
HEMOGLOBIN: 12.3 g/dL (ref 12.0–15.0)
MCH: 30 pg (ref 26.0–34.0)
MCHC: 32.4 g/dL (ref 30.0–36.0)
MCV: 92.7 fL (ref 78.0–100.0)
Platelets: 258 10*3/uL (ref 150–400)
RBC: 4.1 MIL/uL (ref 3.87–5.11)
RDW: 13.8 % (ref 11.5–15.5)
WBC: 7.7 10*3/uL (ref 4.0–10.5)

## 2018-02-03 LAB — CBG MONITORING, ED: GLUCOSE-CAPILLARY: 71 mg/dL (ref 65–99)

## 2018-02-03 NOTE — ED Provider Notes (Signed)
[facility]-EMERGENCY DEPT Provider Note   CSN: [id number] Arrival date & time: 02/03/18  1305     History   Chief Complaint Chief Complaint  Patient presents with  . Weakness    HPI Deanna Meyers is a 84 y.o. female.  The history is provided by the patient and a relative. No language interpreter was used.  Weakness     Deanna Meyers is a 84 y.o. female who presents to the Emergency Department complaining of weakness.  Level V caveat due to AMS.  Is brought in accompanied by her family for change in mental status and weakness.  She currently lives alone at home.  On Sunday she was taken to church by family members was her first time out of the house in several weeks.  Family note that she has had increased shortness of breath with tachypnea and labored breathing at times, significantly worsening over the last several days.  It took her up to her PCP today who did an chest x-ray.  She was then referred to the emergency department for lab evaluation.  They state that between the PCPs office in the emergency department she has had a change in mental status with abrupt onset confusion.  No reports of fevers, falls.  Patient denies any chest pain, headache, abdominal pain.  Past Medical History:  Diagnosis Date  . Discoloration of skin of toe 08/30/2011   LA doppler - bilateral ABI's - no evidence of arterial insufficiency; bilateral PVRs - normal waveforms; bilateral toe pressures - mod/severe perfusion for healing  . Hyperlipidemia   . Hypertension   . PVD (peripheral vascular disease) (HCC)    decreased blood flow to toes  . Thyroid disease     Patient Active Problem List   Diagnosis Date Noted  . Influenza A with respiratory manifestations 12/15/2016  . HLD (hyperlipidemia) 12/12/2016  . Dehydration 12/12/2016  . Generalized weakness 12/12/2016  . Lewy body dementia without behavioral disturbance   . Essential hypertension 12/17/2014  . Dyspnea  06/17/2013  . Bronchiectasis without acute exacerbation (HCC) 06/17/2013    Past Surgical History:  Procedure Laterality Date  . CHOLECYSTECTOMY  11/12/2013    OB History    No data available       Home Medications    Prior to Admission medications   Medication Sig Start Date End Date Taking? Authorizing Provider  aspirin EC 81 MG tablet Take 81 mg by mouth daily.   Yes [provider]  clopidogrel (PLAVIX) 75 MG tablet Take 75 mg by mouth daily.   Yes [provider]  donepezil (ARICEPT) 23 MG TABS tablet Take 23 mg by mouth at bedtime.   Yes [provider]  latanoprost (XALATAN) 0.005 % ophthalmic solution Place 1 drop into the left eye at bedtime.  12/13/14  Yes [provider]  levothyroxine (SYNTHROID, LEVOTHROID) 25 MCG tablet Take 25 mcg by mouth daily before breakfast.   Yes [provider]  mirtazapine (REMERON) 15 MG tablet Take 15 mg by mouth at bedtime. 01/02/18  Yes [provider]  Multiple Vitamins-Minerals (EYE VITAMINS PO) Take 1 tablet by mouth daily.    Yes [provider]  triamcinolone cream (KENALOG) 0.1 % Apply 1-2 application topically daily as needed (lesion).  10/04/17  Yes [provider]  benzonatate (TESSALON) 100 MG capsule Take 1 capsule (100 mg total) by mouth 3 (three) times daily as needed for cough. Patient not taking: Reported on 02/03/2018 12/15/16  Catarina Hartshorn, MD    Family History No family history on file.  Social History Social History   Tobacco Use  . Smoking status: Never Smoker  . Smokeless tobacco: Never Used  Substance Use Topics  . Alcohol use: No    Alcohol/week: 0.0 oz  . Drug use: No     Allergies   Patient has no known allergies.   Review of Systems Review of Systems  Neurological: Positive for weakness.  All other systems reviewed and are negative.    Physical Exam Updated Vital Signs BP (!) 165/93   Pulse (!) 58   Temp 98.2 F (36.8 C)  (Oral)   Resp 17   Ht 5\' 3"  (1.6 m)   Wt 59.4 kg (131 lb)   SpO2 98%   BMI 23.21 kg/m   Physical Exam  Constitutional: She appears well-developed and well-nourished.  HENT:  Head: Normocephalic and atraumatic.  Cardiovascular: Normal rate and regular rhythm.  No murmur heard. Pulmonary/Chest: Effort normal and breath sounds normal. No respiratory distress.  Abdominal: Soft. There is no tenderness. There is no rebound and no guarding.  Musculoskeletal: She exhibits no tenderness.  Nonpitting edema to bilateral lower extremities.  Neurological:  Mild left upper facial droop.  There is minimal weakness to the left upper and left lower extremity.  Confused.  Disoriented to time but oriented to place and person.  Drowsy but arousable  Skin: Skin is warm and dry.  Psychiatric: She has a normal mood and affect. Her behavior is normal.  Nursing note and vitals reviewed.    ED Treatments / Results  Labs (all labs ordered are listed, but only abnormal results are displayed) Labs Reviewed  COMPREHENSIVE METABOLIC PANEL - Abnormal; Notable for the following components:      Result Value   Total Protein 6.4 (*)    GFR calc non Af Amer 58 (*)    All other components within normal limits  URINALYSIS, ROUTINE W REFLEX MICROSCOPIC - Abnormal; Notable for the following components:   Color, Urine STRAW (*)    pH 9.0 (*)    All other components within normal limits  CBC  TROPONIN I  CBG MONITORING, ED  CBG MONITORING, ED    EKG  EKG Interpretation  Date/Time:  Friday February 03 2018 13:10:41 EST Ventricular Rate:  59 PR Interval:    QRS Duration: 130 QT Interval:  460 QTC Calculation: 456 R Axis:   -63 Text Interpretation:  Sinus rhythm Ventricular premature complex RBBB and LAFB Left ventricular hypertrophy Confirmed by Tilden Fossa [phone]) on 02/03/2018 2:40:56 PM       Radiology Ct Head Wo Contrast  Result Date: 02/03/2018 CLINICAL DATA:  Altered level of consciousness  with confusion. EXAM: CT HEAD WITHOUT CONTRAST TECHNIQUE: Contiguous axial images were obtained from the base of the skull through the vertex without intravenous contrast. COMPARISON:  09/21/2016 FINDINGS: BRAIN: There is sulcal and ventricular prominence consistent with superficial and central atrophy. No intraparenchymal hemorrhage, mass effect nor midline shift. Periventricular and subcortical white matter hypodensities consistent with mild stable chronic small vessel ischemic disease are identified. No acute large vascular territory infarcts. No abnormal extra-axial fluid collections. Basal cisterns are not effaced and midline. VASCULAR: Moderate calcific atherosclerosis of the carotid siphons. SKULL: No skull fracture. No significant scalp soft tissue swelling. SINUSES/ORBITS: The mastoid air-cells are clear. The included paranasal sinuses are well-aerated.The included ocular globes and orbital contents are non-suspicious. OTHER: None. IMPRESSION: Atrophy with mild chronic small vessel ischemic disease,  stable in appearance. No acute intracranial abnormality. Electronically Signed   By: Tollie Ethavid  Kwon M.D.   On: 02/03/2018 14:44    Procedures Procedures (including critical care time)  Medications Ordered in ED Medications - No data to display   Initial Impression / Assessment and Plan / ED Course  I have reviewed the triage vital signs and the nursing notes.  Pertinent labs & imaging results that were available during my care of the patient were reviewed by me and considered in my medical decision making (see chart for details).     Patient coming from home accompanied by her family for evaluation of increased shortness of breath for the last week now with change in mental status for the last several hours.  On initial evaluation she was very encephalopathic and confused with some left-sided weakness.  On repeat assessment in the emergency department she is awake and interactive and at her  baseline neurologic status.  Discussed with patient and family findings of studies.  No evidence of acute stroke or infectious process or metabolic process.  Discussed recommendation for admission for further monitoring stroke, seizure or other serious illness cannot be ruled out at this time.  Patient states that she absolutely does not want hospitalization and wants to return home.  She does agree to allowing family to assist her more at home.  Discussed with patient and family that there is a risk of misdiagnosis that she is not getting a full workup for possible TIA or stroke and she acknowledges this risk.  Plan to discharge home with close outpatient follow-up as well as return precautions.  Final Clinical Impressions(s) / ED Diagnoses   Final diagnoses:  Acute confusional state  Shortness of breath    ED Discharge Orders    None       Tilden Fossaees, Imane Burrough, MD 02/03/18 1649

## 2018-02-03 NOTE — ED Notes (Signed)
ED Provider at bedside. 

## 2018-02-03 NOTE — ED Triage Notes (Signed)
Patient presents with daughter and other family. Pt coming from PCP office after having chest xray ruling out pneumonia. Pt was unable to get out of car without maximum assist. Patient appears lethargic and daughter states that began while driving here. CBG 71 pt denies any pain.

## 2018-02-03 NOTE — ED Notes (Signed)
Delay in lab work, IV obtained, but unable to obtain lab work from IV.

## 2018-02-03 NOTE — ED Notes (Signed)
Bed: WA04 Expected date:  Expected time:  Means of arrival:  Comments: Pt in room 

## 2018-02-03 NOTE — ED Notes (Signed)
Patient transported to X-ray 

## 2018-02-03 NOTE — ED Notes (Signed)
2x unsuccessful IV attempt 

## 2018-10-29 DIAGNOSIS — Z8673 Personal history of transient ischemic attack (TIA), and cerebral infarction without residual deficits: Secondary | ICD-10-CM

## 2018-10-29 DIAGNOSIS — S32020A Wedge compression fracture of second lumbar vertebra, initial encounter for closed fracture: Secondary | ICD-10-CM | POA: Diagnosis not present

## 2018-10-29 DIAGNOSIS — I10 Essential (primary) hypertension: Secondary | ICD-10-CM

## 2018-10-29 DIAGNOSIS — F0390 Unspecified dementia without behavioral disturbance: Secondary | ICD-10-CM | POA: Diagnosis not present

## 2018-10-30 DIAGNOSIS — I10 Essential (primary) hypertension: Secondary | ICD-10-CM | POA: Diagnosis not present

## 2018-10-30 DIAGNOSIS — S32020A Wedge compression fracture of second lumbar vertebra, initial encounter for closed fracture: Secondary | ICD-10-CM | POA: Diagnosis not present

## 2018-10-30 DIAGNOSIS — F0390 Unspecified dementia without behavioral disturbance: Secondary | ICD-10-CM | POA: Diagnosis not present

## 2018-10-30 DIAGNOSIS — Z8673 Personal history of transient ischemic attack (TIA), and cerebral infarction without residual deficits: Secondary | ICD-10-CM | POA: Diagnosis not present

## 2018-10-31 DIAGNOSIS — Z01818 Encounter for other preprocedural examination: Secondary | ICD-10-CM

## 2018-10-31 DIAGNOSIS — F0390 Unspecified dementia without behavioral disturbance: Secondary | ICD-10-CM | POA: Diagnosis not present

## 2018-10-31 DIAGNOSIS — Z8673 Personal history of transient ischemic attack (TIA), and cerebral infarction without residual deficits: Secondary | ICD-10-CM | POA: Diagnosis not present

## 2018-10-31 DIAGNOSIS — S32020A Wedge compression fracture of second lumbar vertebra, initial encounter for closed fracture: Secondary | ICD-10-CM | POA: Diagnosis not present

## 2018-10-31 DIAGNOSIS — I10 Essential (primary) hypertension: Secondary | ICD-10-CM | POA: Diagnosis not present

## 2018-11-01 DIAGNOSIS — S32020A Wedge compression fracture of second lumbar vertebra, initial encounter for closed fracture: Secondary | ICD-10-CM | POA: Diagnosis not present

## 2018-11-01 DIAGNOSIS — R0902 Hypoxemia: Secondary | ICD-10-CM | POA: Diagnosis not present

## 2018-11-02 DIAGNOSIS — R0902 Hypoxemia: Secondary | ICD-10-CM | POA: Diagnosis not present

## 2018-11-02 DIAGNOSIS — S32020A Wedge compression fracture of second lumbar vertebra, initial encounter for closed fracture: Secondary | ICD-10-CM | POA: Diagnosis not present

## 2018-11-03 DIAGNOSIS — S32020A Wedge compression fracture of second lumbar vertebra, initial encounter for closed fracture: Secondary | ICD-10-CM | POA: Diagnosis not present

## 2018-11-03 DIAGNOSIS — R0902 Hypoxemia: Secondary | ICD-10-CM | POA: Diagnosis not present

## 2021-03-29 DEATH — deceased
